# Patient Record
Sex: Male | Born: 1955 | Race: White | Hispanic: No | Marital: Married | Smoking: Never smoker
Health system: Southern US, Community
[De-identification: ages and names within clinical notes are randomized; demographics above are authoritative.]

## PROBLEM LIST (undated history)

## (undated) DIAGNOSIS — Z973 Presence of spectacles and contact lenses: Secondary | ICD-10-CM

## (undated) DIAGNOSIS — Z8719 Personal history of other diseases of the digestive system: Secondary | ICD-10-CM

## (undated) DIAGNOSIS — K219 Gastro-esophageal reflux disease without esophagitis: Secondary | ICD-10-CM

## (undated) DIAGNOSIS — M1990 Unspecified osteoarthritis, unspecified site: Secondary | ICD-10-CM

## (undated) DIAGNOSIS — I10 Essential (primary) hypertension: Secondary | ICD-10-CM

## (undated) HISTORY — DX: Essential (primary) hypertension: I10

## (undated) HISTORY — PX: KNEE SURGERY: SHX244

## (undated) HISTORY — PX: ANKLE FRACTURE SURGERY: SHX122

## (undated) HISTORY — PX: VASECTOMY: SHX75

---

## 2002-01-27 ENCOUNTER — Encounter: Admission: RE | Admit: 2002-01-27 | Discharge: 2002-02-20 | Payer: Self-pay | Admitting: Orthopaedic Surgery

## 2003-10-31 ENCOUNTER — Ambulatory Visit (HOSPITAL_COMMUNITY): Admission: EM | Admit: 2003-10-31 | Discharge: 2003-10-31 | Payer: Self-pay | Admitting: Emergency Medicine

## 2010-04-25 ENCOUNTER — Ambulatory Visit: Payer: Self-pay | Admitting: Orthopedic Surgery

## 2010-04-25 DIAGNOSIS — IMO0002 Reserved for concepts with insufficient information to code with codable children: Secondary | ICD-10-CM | POA: Insufficient documentation

## 2010-04-25 DIAGNOSIS — M5430 Sciatica, unspecified side: Secondary | ICD-10-CM | POA: Insufficient documentation

## 2010-04-25 DIAGNOSIS — M1710 Unilateral primary osteoarthritis, unspecified knee: Secondary | ICD-10-CM

## 2010-05-26 ENCOUNTER — Emergency Department (HOSPITAL_COMMUNITY)
Admission: EM | Admit: 2010-05-26 | Discharge: 2010-05-26 | Payer: Self-pay | Source: Home / Self Care | Admitting: Emergency Medicine

## 2010-07-11 ENCOUNTER — Ambulatory Visit (HOSPITAL_COMMUNITY)
Admission: RE | Admit: 2010-07-11 | Discharge: 2010-07-11 | Payer: Self-pay | Source: Home / Self Care | Attending: Family Medicine | Admitting: Family Medicine

## 2010-07-26 NOTE — Letter (Signed)
Summary: History Form  History Form   Imported By: Jacklynn Ganong 04/27/2010 08:56:07  _____________________________________________________________________  External Attachment:    Type:   Image     Comment:   External Document

## 2010-07-26 NOTE — Assessment & Plan Note (Signed)
Summary: left knee pain needs xr/umr/bsf   Vital Signs:  Patient profile:   55 year old male Height:      73 inches Weight:      307 pounds Pulse rate:   80 / minute Resp:     18 per minute  Vitals Entered By: Fuller Canada MD (April 25, 2010 4:18 PM)  Visit Type:  new patient Referring Provider:  self Primary Provider:  Dr. Lysbeth Galas  CC:  left knee pain.  History of Present Illness: this is a 55 year old male comes to [place] today complaining of severe history of LEFT knee pain status post one injection several years ago which seemed to help and now has swelling and catching in the LEFT knee.  There is also some lateral thigh pain which he associates with his intermittent sciatica with associated sharp burning sensation along the lateral thigh  Pain level is 5/10.  We'll take an x-ray today.  Meds: Exforge, Crestor, Ibuprofen, Omeprazole.    Allergies (verified): 1)  ! Voltaren  Past History:  Past Medical History: htn cholesterol  Past Surgical History: vasectomy right knee scope left ankle otif  Family History: Family History of Arthritis  Social History: Patient is married.  surgery work no smoking no alcohol coffee and sodas everyday AD Nursing  Review of Systems Constitutional:  Complains of fatigue; denies weight loss, weight gain, fever, and chills. Cardiovascular:  Denies chest pain, palpitations, fainting, and murmurs. Respiratory:  Denies short of breath, wheezing, couch, tightness, pain on inspiration, and snoring . Gastrointestinal:  Denies heartburn, nausea, vomiting, diarrhea, constipation, and blood in your stools. Genitourinary:  Denies frequency, urgency, difficulty urinating, painful urination, flank pain, and bleeding in urine. Neurologic:  Denies numbness, tingling, unsteady gait, dizziness, tremors, and seizure. Musculoskeletal:  Complains of joint pain, swelling, and instability; denies stiffness, redness, heat, and muscle  pain. Endocrine:  Denies excessive thirst, exessive urination, and heat or cold intolerance. Psychiatric:  Denies nervousness, depression, anxiety, and hallucinations. Skin:  Denies changes in the skin, poor healing, rash, itching, and redness. HEENT:  Denies blurred or double vision, eye pain, redness, and watering. Immunology:  Denies seasonal allergies, sinus problems, and allergic to bee stings. Hemoatologic:  Denies easy bleeding and brusing.  Physical Exam  Skin:  intact without lesions or rashes Inguinal Nodes:  no significant adenopathy Psych:  alert and cooperative; normal mood and affect; normal attention span and concentration   Knee Exam  General:    Well-developed, well-nourished, large body habitus; no deformities, normal grooming.  Gait:    Normal heel-toe gait pattern bilaterally.    Vascular:    There was no swelling or varicose veins. The pulses and temperature are normal. There was no edema or tenderness.  Sensory:    Gross coordination and sensation were normal.    Motor:    Motor strength 5/5 bilaterally for quadriceps, hamstrings, ankle dorsiflexion, and ankle plantar flexion.    Reflexes:    Normal and symmetric patellar and Achilles reflexes bilaterally.    Knee Exam:    Right:    Inspection:  Normal    Palpation:  Normal    Stability:  stable    Tenderness:  no    Swelling:  no    Erythema:  no    Range of Motion:       Flexion-Active: full       Extension-Active: full       Flexion-Passive: full       Extension-Passive: full  Left:    Inspection:  Normal    Palpation:  Normal    Stability:  stable    Tenderness:  no    Swelling:  no    Erythema:  no    although he had some mild lateral joint line tenderness this was not in any way very significant or severe and there is no joint effusion.    Range of Motion:       Flexion-Active: full       Extension-Active: full       Flexion-Passive: full       Extension-Passive: full     lumbar spine exam showed lumbar tenderness in the central and LEFT lateral areas including the gluteal region with tenderness and palpable tenderness there.  Straight leg raise was negative today.   Impression & Recommendations:  Problem # 1:  KNEE, ARTHRITIS, DEGEN./OSTEO (ICD-715.96)  knee films AP lateral and patellofemoral x-rays show that there is perhaps some mild degenerative changes but overall his knee looks very good with reasonably normal alignment  Impression normal knee film  Orders: New Patient Level III (04540) Knee x-ray,  3 views (98119)  Problem # 2:  SCIATICA (ICD-724.3)  Orders: New Patient Level III (14782) Knee x-ray,  3 views (95621)  Medications Added to Medication List This Visit: 1)  Prednisone (pak) 10 Mg Tabs (Prednisone) .... As directed 2)  Neurontin 100 Mg Caps (Gabapentin) .Marland Kitchen.. 1 by mouth hs increase up to 3 as needed  Patient Instructions: 1)  Start new meds 2)  come back in 3 weeks Prescriptions: NEURONTIN 100 MG CAPS (GABAPENTIN) 1 by mouth hs increase up to 3 as needed  #60 x 1   Entered and Authorized by:   Fuller Canada MD   Signed by:   Fuller Canada MD on 04/25/2010   Method used:   Print then Give to Patient   RxID:   [id number] PREDNISONE (PAK) 10 MG TABS (PREDNISONE) as directed  #1 x 1   Entered and Authorized by:   Fuller Canada MD   Signed by:   Fuller Canada MD on 04/25/2010   Method used:   Print then Give to Patient   RxID:   [id number]    Orders Added: 1)  New Patient Level III [44034] 2)  Knee x-ray,  3 views [74259]

## 2010-11-11 NOTE — Op Note (Signed)
Jake Clark, Jake Clark                           ACCOUNT NO.:  [id number]   MEDICAL RECORD NO.:  [id number]                   PATIENT TYPE:  EMS   LOCATION:  ED                                   FACILITY[facility]  [facility]   PHYSICIAN:  Lubertha Basque. Jerl Santos, M.D.             DATE OF BIRTH:  [birth year]   DATE OF PROCEDURE:  10/31/2003  DATE OF DISCHARGE:                                 OPERATIVE REPORT   PREOPERATIVE DIAGNOSIS:  Left ankle bimalleolar fracture.   POSTOPERATIVE DIAGNOSIS:  Left ankle bimalleolar fracture.   OPERATION/PROCEDURE:  Left ankle open reduction and internal fixation.   ANESTHESIA:  General.   SURGEON:  Lubertha Basque. Jerl Santos, M.D.   INDICATIONS:  The patient is a 55 year old man who fractured his left ankle  today while cleaning the pool at home.  He sustained a displaced bimalleolar  fracture.  He was seen in the [facility] emergency room and orthopedics  were consulted for evaluation and management.  He opted for ORIF and hope  for better alignment of joint and minimize the chance of degenerative  changes in the future.  Informed operative consent was obtained, after  discussion of possible complications, reaction to anesthesia, infection,  ankle stiffness, and DVT.   DESCRIPTION OF PROCEDURE:  The patient was placed on the operating table  where general anesthesia was applied without difficulty.  He was positioned  supine with a bump under the left hip.  He was prepped and draped in the  normal sterile fashion.  After administration of IV antibiotics, the left  leg was elevated, exsanguinated, and tourniquet inflated about the calf.  A  lateral incision was made with dissection down to the fibula and the  fracture site. This was reduced anatomically with a reduction clamp and  stabilizer for a single interfragmentary screw which was a fully threaded  3.5 mm small fragment screw.  We then added extra stabilization for  __________  one-third tubular side plate which was  appropriately contoured  and then secured to the fibula with six screws.  Five of these were fully  threaded cortical screws and bicortical __________  on each.  Then the sixth  screw was a partially threaded cancellous screw except for the fibula.  Attention was then turned toward the medial aspect.  Incision was made  across the fracture site.  Dissection was carried down to the fracture site.  Ligamentous tissues were used for removing the fracture site and was reduced  anatomically and stabilized the two malleolar screws which were partially  threaded cancellous screws.  I used fluoroscopy throughout the case to make  appropriate intraoperative vision readily to use myself.  The wounds were  irrigated.  Proper reapproximation of the subcutaneous tissue with 0 and 2-0  Vicryl and skin with staples.  Tourniquet was deflated __________  more  immediately.  Marcaine was injected about the incision site followed by  Adaptic  and a dry gauze dressing with the posterior splint of Plaster with  the ankle in neutral position.  Estimated blood loss was __________  .  __________  tourniquet time.   DISPOSITION:  The patient was extubated in the operating room and taken to  the recovery room stable.  Plans for him to go home the same day and follow  up in the office in one week.  I will contact him by phone tonight.                                              Lubertha Basque Jerl Santos, M.D.   PGD/MEDQ  D:  10/31/2003  T:  10/31/2003  Job:  [id number]

## 2011-05-23 ENCOUNTER — Ambulatory Visit (INDEPENDENT_AMBULATORY_CARE_PROVIDER_SITE_OTHER): Payer: 59 | Admitting: Orthopedic Surgery

## 2011-05-23 ENCOUNTER — Encounter: Payer: Self-pay | Admitting: Orthopedic Surgery

## 2011-05-23 VITALS — BP 140/80 | Ht 73.0 in | Wt 314.0 lb

## 2011-05-23 DIAGNOSIS — IMO0002 Reserved for concepts with insufficient information to code with codable children: Secondary | ICD-10-CM

## 2011-05-23 DIAGNOSIS — S83209A Unspecified tear of unspecified meniscus, current injury, unspecified knee, initial encounter: Secondary | ICD-10-CM

## 2011-05-23 NOTE — Progress Notes (Signed)
LEFT knee pain with some LEFT hip pain.  Follow up visit.  Seen last year had an x-ray, which really did not show any significant knee pathology. Presents back same. It is hard for him to straighten his leg. He has anterior knee pain. He will have some lumbar and lateral hip pain at times.  He seems to get relief if he can get the knee to pop.  Review of systems catching, locking, giving way:   His previous x-ray shows that he has some L5-S1 facet arthritis.  Physical examination: RIGHT knee full range of motion no swelling. No tenderness, stability, normal strength, normal skin normal pulses, and temperature, normal.  LEFT knee medial joint line tenderness normal range of motion. Small effusion. Strength normal skin normal stability normal positive McMurray sign. Severe pain with rotation of the leg. Distal neurovascular exam normal.  Recommend Ultracet for pain one q. 4 p.r.n. Pain #60 no refills.  Recommend MRI and a 2 week followup

## 2011-05-23 NOTE — Patient Instructions (Signed)
ultracet for pain 1 q 4 prn

## 2011-05-25 ENCOUNTER — Ambulatory Visit (HOSPITAL_COMMUNITY)
Admission: RE | Admit: 2011-05-25 | Discharge: 2011-05-25 | Disposition: A | Discharge status: Home / Self Care | Payer: 59 | Source: Ambulatory Visit | Attending: Orthopedic Surgery | Admitting: Orthopedic Surgery

## 2011-05-25 DIAGNOSIS — S83209A Unspecified tear of unspecified meniscus, current injury, unspecified knee, initial encounter: Secondary | ICD-10-CM

## 2011-05-25 DIAGNOSIS — M25569 Pain in unspecified knee: Secondary | ICD-10-CM | POA: Insufficient documentation

## 2011-05-25 DIAGNOSIS — R937 Abnormal findings on diagnostic imaging of other parts of musculoskeletal system: Secondary | ICD-10-CM | POA: Insufficient documentation

## 2011-05-26 NOTE — Progress Notes (Signed)
Contacted patient, scheduled appointment as noted.

## 2011-05-29 ENCOUNTER — Encounter: Payer: Self-pay | Admitting: Orthopedic Surgery

## 2011-05-29 ENCOUNTER — Ambulatory Visit (INDEPENDENT_AMBULATORY_CARE_PROVIDER_SITE_OTHER): Payer: 59 | Admitting: Orthopedic Surgery

## 2011-05-29 VITALS — Ht 73.0 in | Wt 314.0 lb

## 2011-05-29 DIAGNOSIS — M6750 Plica syndrome, unspecified knee: Secondary | ICD-10-CM

## 2011-05-29 DIAGNOSIS — M79659 Pain in unspecified thigh: Secondary | ICD-10-CM

## 2011-05-29 DIAGNOSIS — M79609 Pain in unspecified limb: Secondary | ICD-10-CM

## 2011-05-29 NOTE — Progress Notes (Signed)
The patient complained of click in his LEFT leg and knee area complains of thigh pain and pain when he lifts his leg or flex his hip and also when he extends his knee. MRI shows medial plica otherwise, fairly normal. Hip, wrist. 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 Also, having tenderness in the LEFT thigh.  I do not palpate a mass.  RIGHT thigh normal to palpation.  The tenderness in the LEFT eye is approximately mid thigh, lateral, and portion of the thigh muscle.  Recommend x-ray RIGHT thigh, and if negative. Physical therapy with ultrasound treatment, LEFT thigh.  Inject medial plica Knee  Injection Procedure Note  Pre-operative Diagnosis: left knee plica Post-operative Diagnosis: same  Indications: pain  Anesthesia: ethyl chloride   Procedure Details   Verbal consent was obtained for the procedure. Time out was completed.The joint was prepped with alcohol, followed by  Ethyl chloride spray and A 20 gauge needle was inserted into the knee via medial  approach; 4ml 1% lidocaine and 1 ml of depomedrol  was then injected into the joint . The needle was removed and the area cleansed and dressed.  Complications:  None; patient tolerated the procedure well.  Secondary to thigh pain patient was advised to have x-ray of his LEFT femur  AP lateral LEFT femur including hip  Findings the bone looks normal.  There is some loss of femoral head offset but the joint space is normal.  Impression normal femur  Not sure what is causing the thigh pain.  Recommend ultrasound treatments x6 with iontophoresis.  If no improvement MRI left eye  If no improvement from injection related to the plica recommend plica excision.

## 2011-05-29 NOTE — Patient Instructions (Signed)
You have received a steroid shot. 15% of patients experience increased pain at the injection site with in the next 24 hours. This is best treated with ice and tylenol extra strength 2 tabs every 8 hours. If you are still having pain please call the office.    

## 2011-06-08 ENCOUNTER — Ambulatory Visit: Payer: 59 | Admitting: Orthopedic Surgery

## 2011-06-12 ENCOUNTER — Telehealth: Payer: Self-pay | Admitting: Orthopedic Surgery

## 2011-06-12 NOTE — Telephone Encounter (Signed)
Patient called to cancel his follow up appointment for tomorrow,06/13/11, as his therapy will just be starting Wed, 06/14/11.  He elects to call back to re-schedule.

## 2011-06-13 ENCOUNTER — Ambulatory Visit: Payer: 59 | Admitting: Orthopedic Surgery

## 2011-06-14 ENCOUNTER — Ambulatory Visit (HOSPITAL_COMMUNITY)
Admission: RE | Admit: 2011-06-14 | Discharge: 2011-06-14 | Disposition: A | Discharge status: Home / Self Care | Payer: 59 | Source: Ambulatory Visit | Attending: Orthopedic Surgery | Admitting: Orthopedic Surgery

## 2011-06-14 DIAGNOSIS — M25559 Pain in unspecified hip: Secondary | ICD-10-CM | POA: Insufficient documentation

## 2011-06-14 DIAGNOSIS — IMO0001 Reserved for inherently not codable concepts without codable children: Secondary | ICD-10-CM | POA: Insufficient documentation

## 2011-06-14 DIAGNOSIS — M25569 Pain in unspecified knee: Secondary | ICD-10-CM | POA: Insufficient documentation

## 2011-06-14 NOTE — Patient Instructions (Addendum)
HEP stretches

## 2011-06-14 NOTE — Progress Notes (Signed)
Physical Therapy Evaluation  Patient Details  Name: Jake Clark MRN: [id number] Date of Birth: 08/19/1955  Today's Date: 06/14/2011 Time: 2130-8657 Time Calculation (min): 45 min Visit#: 1  of 6   Re-eval: 06/28/11 Assessment Diagnosis: thigh pain Next MD Visit: 06/2011 Prior Therapy: none  Past Medical History:  Past Medical History  Diagnosis Date  . HTN (hypertension)    Past Surgical History:  Past Surgical History  Procedure Date  . Knee surgery   . Ankle fracture surgery   . Vasectomy     Subjective Symptoms/Limitations Symptoms: Mr. Loll states that he began having pain in his left thigh several months ago.  The patient states that he has had back pain for two years which occasionally goes into his thigh and knee but his MD does not think that this is what is causing his pain.   The patient states that the pain is mainly in the side of this hip and runs down into his knee but will occasionally be only in the knee area.   He states tht the pain occurs at least once a week but last week it hurt every day.  So far this week he has not hurt at all.  The patient states he can not find any activity that proceeds his pain.  He has been referred to physical therapy for ultrasound and iontophoresis to try and decrease his symptoms of pain. How long can you sit comfortably?: When he is experiencing the pain sitting  will increase the pain after about 15 minutes. How long can you walk comfortably?: When it is hurting the pain will increase after about 15 minutes. Pain Assessment Currently in Pain?:  The patient denies pain today but states when he does hurt the pain will be at a 9 on a 0-10 scale. 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   Assessment LLE Strength Left Hip Flexion: 4/5 Left Hip Extension: 5/5 Left Hip ABduction: 4/5 Left Hip ADduction: 5/5 Left Knee Flexion: 5/5 Left Knee  Extension: 5/5 Left Ankle Dorsiflexion: 5/5  Exercise/Treatments Stretches Active Hamstring Stretch: 2 reps;60 seconds Quad Stretch: 2 reps;60 seconds ITB Stretch: 2 reps;30 seconds     Modalities Modalities: Ultrasound;Iontophoresis Ultrasound Ultrasound Location: L greater Trochanter Ultrasound Parameters: 1.2 w/cm 2@  1 MgHzx 8:00' Ultrasound Goals: Pain Iontophoresis Type of Iontophoresis: Dexamethasone Location: L hip  Dose: using the original fast pads 1.3 ml Dex Time: 14 hours  Physical Therapy Assessment and Plan PT Assessment and Plan Clinical Impression Statement: Pt with vague subjective pain who may benefit from skilled PT for modalities to decrease pain. Rehab Potential: Good Clinical Impairments Affecting Rehab Potential: Pain, stiffness PT Frequency: Min 3X/week PT Duration:  (2 weeks) PT Plan: see for ultrasound and ionto    Goals Home Exercise Program Pt will Perform Home Exercise Program: Independently PT Short Term Goals Time to Complete Short Term Goals: 2 weeks PT Short Term Goal 1: no pain x 2 weeks  Problem List Patient Active Problem List  Diagnoses  . KNEE, ARTHRITIS, DEGEN./OSTEO  . SCIATICA  . Torn meniscus  . Plica of knee    PT - End of Session Activity Tolerance: Patient tolerated treatment well General Behavior During Session: [facility] for tasks performed Cognition: [facility] for tasks performed   RUSSELL,CINDY 06/14/2011, 4:32 PM  Physician Documentation Your signature is required to indicate approval of the treatment plan as stated above.  Please sign and either  send electronically or make a copy of this report for your files and return this physician signed original.   Please mark one 1.__approve of plan  2. ___approve of plan with the following conditions.   ______________________________                                                          _____________________ Physician Signature                                                                                                              Date

## 2011-06-15 ENCOUNTER — Ambulatory Visit (HOSPITAL_COMMUNITY): Payer: 59

## 2011-06-16 ENCOUNTER — Ambulatory Visit (HOSPITAL_COMMUNITY)
Admission: RE | Admit: 2011-06-16 | Discharge: 2011-06-16 | Disposition: A | Discharge status: Home / Self Care | Payer: 59 | Source: Ambulatory Visit | Attending: Family Medicine | Admitting: Family Medicine

## 2011-06-16 NOTE — Progress Notes (Signed)
Physical Therapy Treatment Patient Details  Name: Jake Clark MRN: [id number] Date of Birth: Jun 03, 1956  Today's Date: 06/16/2011 Time: 1153-1220 Time Calculation (min): 27 min Visit#: 2  of 6   Re-eval: 06/28/11  Charge: [place] 8 min ionto 8 min  Subjective: Symptoms/Limitations Symptoms: Pt reported has had min pain L thigh, hip and lower back.  The pain comes and goes intermittently.  Pain scale today 2-3/10. Pain Assessment Currently in Pain?: Yes Pain Score:   2 Pain Location: Hip Pain Orientation: Left  Objective:     Exercise/Treatments  Modalities Modalities: Ultrasound Ultrasound Ultrasound Location: L greater Trochanter Ultrasound Parameters: 1.2 w/cm2 @ 1 MgHzx 8:00' continuous Ultrasound Goals: Pain Iontophoresis Type of Iontophoresis: Dexamethasone Location: L hip Dose: using the original fast pads 1.3 ml Dex  Time: 14 hours  Physical Therapy Assessment and Plan PT Assessment and Plan Clinical Impression Statement: [place] and ionto complete for today's session.  Pt did report compliance with stretches at home.   PT Plan: Assess pain relief, continue with [place] and ionto.    Goals    Problem List Patient Active Problem List  Diagnoses  . KNEE, ARTHRITIS, DEGEN./OSTEO  . SCIATICA  . Torn meniscus  . Plica of knee    PT - End of Session Activity Tolerance: Patient tolerated treatment well General Behavior During Session: [facility] for tasks performed Cognition: [facility] for tasks performed  Juel Burrow 06/16/2011, 12:26 PM

## 2011-06-22 ENCOUNTER — Ambulatory Visit (HOSPITAL_COMMUNITY): Payer: 59

## 2011-06-22 ENCOUNTER — Telehealth (HOSPITAL_COMMUNITY): Payer: Self-pay

## 2012-03-21 ENCOUNTER — Ambulatory Visit (INDEPENDENT_AMBULATORY_CARE_PROVIDER_SITE_OTHER): Payer: 59 | Admitting: Otolaryngology

## 2015-06-10 ENCOUNTER — Ambulatory Visit: Payer: Self-pay | Admitting: Orthopedic Surgery

## 2015-06-10 NOTE — Progress Notes (Signed)
Preoperative surgical orders have been place into the Epic hospital system for Jake Clark on 06/10/2015, 10:23 AM  by Patrica DuelPERKINS, Sherwood Castilla for surgery on 07-07-2015.  Preop Total Hip orders including Experel Injecion, IV Tylenol, and IV Decadron as long as there are no contraindications to the above medications. Jake Peacerew Jayshawn Colston, PA-C

## 2015-06-29 ENCOUNTER — Ambulatory Visit: Payer: Self-pay | Admitting: Orthopedic Surgery

## 2015-06-29 NOTE — H&P (Signed)
Jake Clark DOB: [birth year] Married / Language: English / Race: White Male Date of Admission:  07/07/2015 CC:  Left hip pain History of Present Illness The patient is a 60 year old male who comes in for a preoperative History and Physical. The patient is scheduled for a left total hip arthroplasty (anterior) to be performed by Dr. Gus RankinFrank V. Aluisio, MD at [facility] on 07-07-2015. The patient is a 60 year old male who presented for follow up of their hip. The patient is being followed for their left hip pain and osteoarthritis. They are now months out from intra-articular injection. Symptoms reported include: pain and aching. The patient feels that they are doing poorly and report their pain level to be moderate to severe. The following medication has been used for pain control: antiinflammatory medication (meloxicam). The patient has not gotten any relief of their symptoms with Cortisone injections. Jake Clark said that the hip injection provided very short term benefit. His hip is getting progressively worse over time. Pain is in his groin radiating to his knee. It is limiting what he can and cannot do. He is occasionally getting pain at night. His right hip does not bother him. He is ready to get the left hip fixed. They have been treated conservatively in the past for the above stated problem and despite conservative measures, they continue to have progressive pain and severe functional limitations and dysfunction. They have failed non-operative management including home exercise, medications, and injections. It is felt that they would benefit from undergoing total joint replacement. Risks and benefits of the procedure have been discussed with the patient and they elect to proceed with surgery. There are no active contraindications to surgery such as ongoing infection or rapidly progressive neurological disease.  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 Latex  skin sensitivity  Family History Rheumatoid Arthritis  mother and father  Social History Living situation  live with spouse Marital status  married Illicit drug use  no Drug/Alcohol Rehab (Previously)  no Exercise  Exercises weekly; does running / walking Tobacco use  never smoker Number of flights of stairs before winded  4-5 Pain Contract  no Drug/Alcohol Rehab (Currently)  no Children  1 Current work status  working full time Alcohol use  never consumed alcohol  Medication History AmLODIPine Besylate (5MG  Tablet, Oral) Active. Valsartan (160MG  Tablet, Oral) Active. Lipitor (10MG  Tablet, Oral) Active. Mobic (7.5MG  Tablet, Oral two times daily) Active. HydroCHLOROthiazide (12.5MG  Tablet, Oral) Active. Aspirin (81MG  Tablet Chewable, Oral) Active.  Past Surgical History Vasectomy  Ankle Surgery  left Arthroscopy of Knee  left  Review of Systems General Not Present- Chills, Fatigue, Fever, Memory Loss, Night Sweats, Weight Gain and Weight Loss. Skin Not Present- Eczema, Hives, Itching, Lesions and Rash. HEENT Not Present- Dentures, Double Vision, Headache, Hearing Loss, Tinnitus and Visual Loss. Respiratory Not Present- Allergies, Chronic Cough, Coughing up blood, Shortness of breath at rest and Shortness of breath with exertion. Cardiovascular Not Present- Chest Pain, Difficulty Breathing Lying Down, Murmur, Palpitations, Racing/skipping heartbeats and Swelling. Gastrointestinal Not Present- Abdominal Pain, Bloody Stool, Constipation, Diarrhea, Difficulty Swallowing, Heartburn, Jaundice, Loss of appetitie, Nausea and Vomiting. Male Genitourinary Not Present- Blood in Urine, Discharge, Flank Pain, Incontinence, Painful  Urination, Urgency, Urinary frequency, Urinary Retention, Urinating at Night and Weak  urinary stream. Musculoskeletal Present- Joint Pain. Not Present- Back Pain, Joint Swelling, Morning Stiffness, Muscle Pain, Muscle Weakness and Spasms. Neurological Not Present- Blackout spells, Difficulty with balance, Dizziness, Paralysis, Tremor and Weakness. Psychiatric Not Present- Insomnia.  Vitals Weight: 269 lb Height: 72in Body Surface Area: 2.42 m Body Mass Index: 36.48 kg/m  BP: 142/76 (Sitting, Right Arm, Standard)   Physical Exam  General Mental Status -Alert, cooperative and good historian. General Appearance-pleasant, Not in acute distress. Orientation-Oriented X3. Build & Nutrition-Well nourished and Well developed.  Head and Neck Head-normocephalic, atraumatic . Neck Global Assessment - supple, no bruit auscultated on the right, no bruit auscultated on the left.  Eye Vision-Wears contact lenses. Pupil - Bilateral-Regular and Round. Motion - Bilateral-EOMI.  Chest and Lung Exam Auscultation Breath sounds - clear at anterior chest wall and clear at posterior chest wall. Adventitious sounds - No Adventitious sounds.  Cardiovascular Auscultation Rhythm - Regular rate and rhythm. Heart Sounds - S1 WNL and S2 WNL. Murmurs & Other Heart Sounds - Auscultation of the heart reveals - No Murmurs.  Abdomen Palpation/Percussion Tenderness - Abdomen is non-tender to palpation. Rigidity (guarding) - Abdomen is soft. Auscultation Auscultation of the abdomen reveals - Bowel sounds normal.  Male Genitourinary Note: Not done, not pertinent to present illness   Musculoskeletal Note: On exam, he is alert and oriented, in no apparent distress. His right hip has normal range of motion without discomfort. His left hip flexion is to about 100, no internal rotation, about 20 external rotation, 20 abduction.  RADIOGRAPHS AP pelvis and lateral of the hip and he has  got bone on bone arthritic change with osteophyte formation.  Assessment & Plan Primary osteoarthritis of left hip (Principal Diagnosis) (M16.12)  Note:Surgical Plans: Left Total Hip Replacement - Anterior Approach  Disposition: Home with wife  PCP: Dr. Lysbeth GalasNyland  IV TXA  Anesthesia Issues: None  Signed electronically by Lauraine RinneAlexzandrew L Perkins, III PA-C

## 2015-06-30 ENCOUNTER — Encounter (HOSPITAL_COMMUNITY): Admission: RE | Admit: 2015-06-30 | Payer: 59 | Source: Ambulatory Visit

## 2015-07-01 ENCOUNTER — Encounter (HOSPITAL_COMMUNITY): Payer: Self-pay

## 2015-07-01 ENCOUNTER — Encounter (HOSPITAL_COMMUNITY)
Admission: RE | Admit: 2015-07-01 | Discharge: 2015-07-01 | Disposition: A | Discharge status: Home / Self Care | Payer: 59 | Source: Ambulatory Visit | Attending: Orthopedic Surgery | Admitting: Orthopedic Surgery

## 2015-07-01 DIAGNOSIS — Z01818 Encounter for other preprocedural examination: Secondary | ICD-10-CM | POA: Insufficient documentation

## 2015-07-01 DIAGNOSIS — M1612 Unilateral primary osteoarthritis, left hip: Secondary | ICD-10-CM | POA: Diagnosis not present

## 2015-07-01 HISTORY — DX: Presence of spectacles and contact lenses: Z97.3

## 2015-07-01 HISTORY — DX: Personal history of other diseases of the digestive system: Z87.19

## 2015-07-01 HISTORY — DX: Unspecified osteoarthritis, unspecified site: M19.90

## 2015-07-01 HISTORY — DX: Gastro-esophageal reflux disease without esophagitis: K21.9

## 2015-07-01 LAB — URINALYSIS, ROUTINE W REFLEX MICROSCOPIC
Bilirubin Urine: NEGATIVE
GLUCOSE, UA: NEGATIVE mg/dL
HGB URINE DIPSTICK: NEGATIVE
KETONES UR: NEGATIVE mg/dL
Nitrite: NEGATIVE
PH: 6 (ref 5.0–8.0)
PROTEIN: NEGATIVE mg/dL
Specific Gravity, Urine: 1.017 (ref 1.005–1.030)

## 2015-07-01 LAB — URINE MICROSCOPIC-ADD ON

## 2015-07-01 LAB — SURGICAL PCR SCREEN
MRSA, PCR: NEGATIVE
Staphylococcus aureus: NEGATIVE

## 2015-07-01 LAB — COMPREHENSIVE METABOLIC PANEL
ALT: 21 U/L (ref 17–63)
ANION GAP: 8 (ref 5–15)
AST: 19 U/L (ref 15–41)
Albumin: 4.2 g/dL (ref 3.5–5.0)
Alkaline Phosphatase: 116 U/L (ref 38–126)
BILIRUBIN TOTAL: 0.9 mg/dL (ref 0.3–1.2)
BUN: 11 mg/dL (ref 6–20)
CHLORIDE: 105 mmol/L (ref 101–111)
CO2: 28 mmol/L (ref 22–32)
Calcium: 9.5 mg/dL (ref 8.9–10.3)
Creatinine, Ser: 0.64 mg/dL (ref 0.61–1.24)
Glucose, Bld: 113 mg/dL — ABNORMAL HIGH (ref 65–99)
POTASSIUM: 3.8 mmol/L (ref 3.5–5.1)
Sodium: 141 mmol/L (ref 135–145)
TOTAL PROTEIN: 7.2 g/dL (ref 6.5–8.1)

## 2015-07-01 LAB — PROTIME-INR
INR: 0.98 (ref 0.00–1.49)
PROTHROMBIN TIME: 13.2 s (ref 11.6–15.2)

## 2015-07-01 LAB — CBC
HEMATOCRIT: 44.5 % (ref 39.0–52.0)
Hemoglobin: 14.7 g/dL (ref 13.0–17.0)
MCH: 29.1 pg (ref 26.0–34.0)
MCHC: 33 g/dL (ref 30.0–36.0)
MCV: 87.9 fL (ref 78.0–100.0)
PLATELETS: 273 10*3/uL (ref 150–400)
RBC: 5.06 MIL/uL (ref 4.22–5.81)
RDW: 12.5 % (ref 11.5–15.5)
WBC: 7.1 10*3/uL (ref 4.0–10.5)

## 2015-07-01 LAB — ABO/RH: ABO/RH(D): B POS

## 2015-07-01 LAB — APTT: aPTT: 28 seconds (ref 24–37)

## 2015-07-01 NOTE — Progress Notes (Signed)
Your patient has screened at an elevated risk for Obstructive Sleep Apnea using the Stop-Bang Tool during a pre-surgical visit. Patient scored at high risk.  

## 2015-07-01 NOTE — Progress Notes (Signed)
EKG / chart 02/25/2015

## 2015-07-01 NOTE — Patient Instructions (Signed)
Jake Clark  07/01/2015   Your procedure is scheduled on: Wednesday July 07, 2015   [facility]  Entrance take [place]  elevators to 3rd floor to  Short Stay Center at 6:30 AM.  Call this number if you have problems the morning of surgery [phone]   Remember: ONLY 1 PERSON MAY GO WITH YOU TO SHORT STAY TO GET  READY MORNING OF YOUR SURGERY.  Do not eat food or drink liquids :After Midnight.     Take these medicines the morning of surgery with A SIP OF WATER: Amlodipine (Norvasc)                               You may not have any metal on your body including hair pins and              piercings  Do not wear jewelry,otions, powders or colognes, deodorant                           Men may shave face and neck.   Do not bring valuables to the hospital. [facility] IS NOT             RESPONSIBLE   FOR VALUABLES.  Contacts, dentures or bridgework may not be worn into surgery.  Leave suitcase in the car. After surgery it may be brought to your room.                Please read over the following fact sheets you were given:INCENTIVE SPIROMETER; MRSA INFORMATION SHEET; BLOOD TRANSFUSION INFORMATION SHEET  _____________________________________________________________________             [facility] - Preparing for Surgery Before surgery, you can play an important role.  Because skin is not sterile, your skin needs to be as free of germs as possible.  You can reduce the number of germs on your skin by washing with CHG (chlorahexidine gluconate) soap before surgery.  CHG is an antiseptic cleaner which kills germs and bonds with the skin to continue killing germs even after washing. Please DO NOT use if you have an allergy to CHG or antibacterial soaps.  If your skin becomes reddened/irritated stop using the CHG and inform your nurse when you arrive at Short Stay. Do not shave (including legs and underarms) for at least 48 hours prior to the first CHG  shower.  You may shave your face/neck. Please follow these instructions carefully:  1.  Shower with CHG Soap the night before surgery and the  morning of Surgery.  2.  If you choose to wash your hair, wash your hair first as usual with your  normal  shampoo.  3.  After you shampoo, rinse your hair and body thoroughly to remove the  shampoo.                           4.  Use CHG as you would any other liquid soap.  You can apply chg directly  to the skin and wash                       Gently with a scrungie or clean washcloth.  5.  Apply the CHG Soap to your body ONLY FROM  THE NECK DOWN.   Do not use on face/ open                           Wound or open sores. Avoid contact with eyes, ears mouth and genitals (private parts).                       Wash face,  Genitals (private parts) with your normal soap.             6.  Wash thoroughly, paying special attention to the area where your surgery  will be performed.  7.  Thoroughly rinse your body with warm water from the neck down.  8.  DO NOT shower/wash with your normal soap after using and rinsing off  the CHG Soap.                9.  Pat yourself dry with a clean towel.            10.  Wear clean pajamas.            11.  Place clean sheets on your bed the night of your first shower and do not  sleep with pets. Day of Surgery : Do not apply any lotions/deodorants the morning of surgery.  Please wear clean clothes to the hospital/surgery center.  FAILURE TO FOLLOW THESE INSTRUCTIONS MAY RESULT IN THE CANCELLATION OF YOUR SURGERY PATIENT SIGNATURE_________________________________  NURSE SIGNATURE__________________________________  ________________________________________________________________________   Jake Clark  An incentive spirometer is a tool that can help keep your lungs clear and active. This tool measures how well you are filling your lungs with each breath. Taking long deep breaths may help reverse or decrease the chance  of developing breathing (pulmonary) problems (especially infection) following:  A long period of time when you are unable to move or be active. BEFORE THE PROCEDURE   If the spirometer includes an indicator to show your best effort, your nurse or respiratory therapist will set it to a desired goal.  If possible, sit up straight or lean slightly forward. Try not to slouch.  Hold the incentive spirometer in an upright position. INSTRUCTIONS FOR USE   Sit on the edge of your bed if possible, or sit up as far as you can in bed or on a chair.  Hold the incentive spirometer in an upright position.  Breathe out normally.  Place the mouthpiece in your mouth and seal your lips tightly around it.  Breathe in slowly and as deeply as possible, raising the piston or the ball toward the top of the column.  Hold your breath for 3-5 seconds or for as long as possible. Allow the piston or ball to fall to the bottom of the column.  Remove the mouthpiece from your mouth and breathe out normally.  Rest for a few seconds and repeat Steps 1 through 7 at least 10 times every 1-2 hours when you are awake. Take your time and take a few normal breaths between deep breaths.  The spirometer may include an indicator to show your best effort. Use the indicator as a goal to work toward during each repetition.  After each set of 10 deep breaths, practice coughing to be sure your lungs are clear. If you have an incision (the cut made at the time of surgery), support your incision when coughing by placing a pillow or rolled up towels firmly against it. Once you are  able to get out of bed, walk around indoors and cough well. You may stop using the incentive spirometer when instructed by your caregiver.  RISKS AND COMPLICATIONS  Take your time so you do not get dizzy or light-headed.  If you are in pain, you may need to take or ask for pain medication before doing incentive spirometry. It is harder to take a deep  breath if you are having pain. AFTER USE  Rest and breathe slowly and easily.  It can be helpful to keep track of a log of your progress. Your caregiver can provide you with a simple table to help with this. If you are using the spirometer at home, follow these instructions: [place] IF:   You are having difficultly using the spirometer.  You have trouble using the spirometer as often as instructed.  Your pain medication is not giving enough relief while using the spirometer.  You develop fever of 100.5 F (38.1 C) or higher. SEEK IMMEDIATE MEDICAL CARE IF:   You cough up bloody sputum that had not been present before.  You develop fever of 102 F (38.9 C) or greater.  You develop worsening pain at or near the incision site. MAKE SURE YOU:   Understand these instructions.  Will watch your condition.  Will get help right away if you are not doing well or get worse. Document Released: 10/23/2006 Document Revised: 09/04/2011 Document Reviewed: 12/24/2006 ExitCare Patient Information 2014 Exit[employer], [place].   ________________________________________________________________________  WHAT IS A BLOOD TRANSFUSION? Blood Transfusion Information  A transfusion is the replacement of blood or some of its parts. Blood is made up of multiple cells which provide different functions.  Red blood cells carry oxygen and are used for blood loss replacement.  White blood cells fight against infection.  Platelets control bleeding.  Plasma helps clot blood.  Other blood products are available for specialized needs, such as hemophilia or other clotting disorders. BEFORE THE TRANSFUSION  Who gives blood for transfusions?   Healthy volunteers who are fully evaluated to make sure their blood is safe. This is blood bank blood. Transfusion therapy is the safest it has ever been in the practice of medicine. Before blood is taken from a donor, a complete history is taken to make sure  that person has no history of diseases nor engages in risky social behavior (examples are intravenous drug use or sexual activity with multiple partners). The donor's travel history is screened to minimize risk of transmitting infections, such as malaria. The donated blood is tested for signs of infectious diseases, such as HIV and hepatitis. The blood is then tested to be sure it is compatible with you in order to minimize the chance of a transfusion reaction. If you or a relative donates blood, this is often done in anticipation of surgery and is not appropriate for emergency situations. It takes many days to process the donated blood. RISKS AND COMPLICATIONS Although transfusion therapy is very safe and saves many lives, the main dangers of transfusion include:   Getting an infectious disease.  Developing a transfusion reaction. This is an allergic reaction to something in the blood you were given. Every precaution is taken to prevent this. The decision to have a blood transfusion has been considered carefully by your caregiver before blood is given. Blood is not given unless the benefits outweigh the risks. AFTER THE TRANSFUSION  Right after receiving a blood transfusion, you will usually feel much better and more energetic. This is especially  true if your red blood cells have gotten low (anemic). The transfusion raises the level of the red blood cells which carry oxygen, and this usually causes an energy increase.  The nurse administering the transfusion will monitor you carefully for complications. HOME CARE INSTRUCTIONS  No special instructions are needed after a transfusion. You may find your energy is better. Speak with your caregiver about any limitations on activity for underlying diseases you may have. SEEK MEDICAL CARE IF:   Your condition is not improving after your transfusion.  You develop redness or irritation at the intravenous (IV) site. SEEK IMMEDIATE MEDICAL CARE IF:  Any of  the following symptoms occur over the next 12 hours:  Shaking chills.  You have a temperature by mouth above 102 F (38.9 C), not controlled by medicine.  Chest, back, or muscle pain.  People around you feel you are not acting correctly or are confused.  Shortness of breath or difficulty breathing.  Dizziness and fainting.  You get a rash or develop hives.  You have a decrease in urine output.  Your urine turns a dark color or changes to pink, red, or brown. Any of the following symptoms occur over the next 10 days:  You have a temperature by mouth above 102 F (38.9 C), not controlled by medicine.  Shortness of breath.  Weakness after normal activity.  The white part of the eye turns yellow (jaundice).  You have a decrease in the amount of urine or are urinating less often.  Your urine turns a dark color or changes to pink, red, or brown. Document Released: 06/09/2000 Document Revised: 09/04/2011 Document Reviewed: 01/27/2008 [facility] Patient Information 2014 [place], [place].  _______________________________________________________________________

## 2015-07-07 ENCOUNTER — Inpatient Hospital Stay (HOSPITAL_COMMUNITY): Payer: 59 | Admitting: Registered Nurse

## 2015-07-07 ENCOUNTER — Inpatient Hospital Stay (HOSPITAL_COMMUNITY): Payer: 59

## 2015-07-07 ENCOUNTER — Encounter (HOSPITAL_COMMUNITY)
Admission: RE | Disposition: A | Discharge status: Home Health | Payer: Self-pay | Source: Ambulatory Visit | Attending: Orthopedic Surgery

## 2015-07-07 ENCOUNTER — Encounter (HOSPITAL_COMMUNITY): Payer: Self-pay

## 2015-07-07 ENCOUNTER — Inpatient Hospital Stay (HOSPITAL_COMMUNITY)
Admission: RE | Admit: 2015-07-07 | Discharge: 2015-07-08 | DRG: 470 | Disposition: A | Discharge status: Home Health | Payer: 59 | Source: Ambulatory Visit | Attending: Orthopedic Surgery | Admitting: Orthopedic Surgery

## 2015-07-07 DIAGNOSIS — K219 Gastro-esophageal reflux disease without esophagitis: Secondary | ICD-10-CM | POA: Diagnosis present

## 2015-07-07 DIAGNOSIS — Z6837 Body mass index (BMI) 37.0-37.9, adult: Secondary | ICD-10-CM | POA: Diagnosis not present

## 2015-07-07 DIAGNOSIS — G8929 Other chronic pain: Secondary | ICD-10-CM | POA: Diagnosis present

## 2015-07-07 DIAGNOSIS — Z791 Long term (current) use of non-steroidal anti-inflammatories (NSAID): Secondary | ICD-10-CM

## 2015-07-07 DIAGNOSIS — Z8261 Family history of arthritis: Secondary | ICD-10-CM | POA: Diagnosis not present

## 2015-07-07 DIAGNOSIS — M5136 Other intervertebral disc degeneration, lumbar region: Secondary | ICD-10-CM | POA: Diagnosis present

## 2015-07-07 DIAGNOSIS — Z01812 Encounter for preprocedural laboratory examination: Secondary | ICD-10-CM | POA: Diagnosis not present

## 2015-07-07 DIAGNOSIS — M1612 Unilateral primary osteoarthritis, left hip: Principal | ICD-10-CM | POA: Diagnosis present

## 2015-07-07 DIAGNOSIS — I10 Essential (primary) hypertension: Secondary | ICD-10-CM | POA: Diagnosis present

## 2015-07-07 DIAGNOSIS — Z91040 Latex allergy status: Secondary | ICD-10-CM | POA: Diagnosis not present

## 2015-07-07 DIAGNOSIS — Z7982 Long term (current) use of aspirin: Secondary | ICD-10-CM | POA: Diagnosis not present

## 2015-07-07 DIAGNOSIS — Z79899 Other long term (current) drug therapy: Secondary | ICD-10-CM

## 2015-07-07 DIAGNOSIS — M25752 Osteophyte, left hip: Secondary | ICD-10-CM | POA: Diagnosis present

## 2015-07-07 DIAGNOSIS — Z886 Allergy status to analgesic agent status: Secondary | ICD-10-CM

## 2015-07-07 DIAGNOSIS — M169 Osteoarthritis of hip, unspecified: Secondary | ICD-10-CM | POA: Diagnosis present

## 2015-07-07 DIAGNOSIS — M25552 Pain in left hip: Secondary | ICD-10-CM | POA: Diagnosis present

## 2015-07-07 DIAGNOSIS — Z96649 Presence of unspecified artificial hip joint: Secondary | ICD-10-CM

## 2015-07-07 HISTORY — PX: TOTAL HIP ARTHROPLASTY: SHX124

## 2015-07-07 LAB — TYPE AND SCREEN
ABO/RH(D): B POS
ANTIBODY SCREEN: NEGATIVE

## 2015-07-07 SURGERY — ARTHROPLASTY, HIP, TOTAL, ANTERIOR APPROACH
Anesthesia: Spinal | Site: Hip | Laterality: Left

## 2015-07-07 MED ORDER — METHOCARBAMOL 500 MG PO TABS: 500.0000 mg | ORAL_TABLET | Freq: Four times a day (QID) | ORAL | Status: DC | PRN

## 2015-07-07 MED ORDER — DEXAMETHASONE SODIUM PHOSPHATE 10 MG/ML IJ SOLN
10.0000 mg | Freq: Once | INTRAMUSCULAR | Status: AC
  Administered 2015-07-08: 10 mg via INTRAVENOUS
  Filled 2015-07-07 (×2): qty 1

## 2015-07-07 MED ORDER — EPHEDRINE SULFATE 50 MG/ML IJ SOLN
INTRAMUSCULAR | Status: DC | PRN
  Administered 2015-07-07 (×3): 5 mg via INTRAVENOUS

## 2015-07-07 MED ORDER — PHENYLEPHRINE HCL 10 MG/ML IJ SOLN
INTRAMUSCULAR | Status: AC
  Filled 2015-07-07: qty 2

## 2015-07-07 MED ORDER — ONDANSETRON HCL 4 MG PO TABS: 4.0000 mg | ORAL_TABLET | Freq: Four times a day (QID) | ORAL | Status: DC | PRN

## 2015-07-07 MED ORDER — HYDROMORPHONE HCL 1 MG/ML IJ SOLN
INTRAMUSCULAR | Status: AC
  Filled 2015-07-07: qty 1

## 2015-07-07 MED ORDER — ACETAMINOPHEN 500 MG PO TABS
1000.0000 mg | ORAL_TABLET | Freq: Four times a day (QID) | ORAL | Status: AC
  Administered 2015-07-07 – 2015-07-08 (×4): 1000 mg via ORAL
  Filled 2015-07-07 (×4): qty 2

## 2015-07-07 MED ORDER — ACETAMINOPHEN 10 MG/ML IV SOLN
INTRAVENOUS | Status: AC
  Filled 2015-07-07: qty 100

## 2015-07-07 MED ORDER — OXYCODONE HCL 5 MG PO TABS
5.0000 mg | ORAL_TABLET | ORAL | Status: DC | PRN
  Administered 2015-07-07 – 2015-07-08 (×6): 10 mg via ORAL
  Filled 2015-07-07 (×5): qty 2

## 2015-07-07 MED ORDER — HYDROMORPHONE HCL 1 MG/ML IJ SOLN: 0.5000 mg | INTRAMUSCULAR | Status: DC | PRN

## 2015-07-07 MED ORDER — TRANEXAMIC ACID 1000 MG/10ML IV SOLN
1000.0000 mg | INTRAVENOUS | Status: AC
  Administered 2015-07-07: 1000 mg via INTRAVENOUS
  Filled 2015-07-07: qty 10

## 2015-07-07 MED ORDER — DEXAMETHASONE SODIUM PHOSPHATE 10 MG/ML IJ SOLN
10.0000 mg | Freq: Once | INTRAMUSCULAR | Status: AC
  Administered 2015-07-07: 10 mg via INTRAVENOUS

## 2015-07-07 MED ORDER — LACTATED RINGERS IV SOLN
INTRAVENOUS | Status: DC | PRN
  Administered 2015-07-07 (×2): via INTRAVENOUS

## 2015-07-07 MED ORDER — HYDROCHLOROTHIAZIDE 12.5 MG PO CAPS
12.5000 mg | ORAL_CAPSULE | Freq: Every day | ORAL | Status: DC
  Administered 2015-07-08: 12.5 mg via ORAL
  Filled 2015-07-07: qty 1

## 2015-07-07 MED ORDER — ONDANSETRON HCL 4 MG/2ML IJ SOLN
INTRAMUSCULAR | Status: AC
  Filled 2015-07-07: qty 2

## 2015-07-07 MED ORDER — POLYETHYLENE GLYCOL 3350 17 G PO PACK
17.0000 g | PACK | Freq: Every day | ORAL | Status: DC | PRN
Start: 2015-07-07 — End: 2015-07-08

## 2015-07-07 MED ORDER — ONDANSETRON HCL 4 MG/2ML IJ SOLN
INTRAMUSCULAR | Status: DC | PRN
  Administered 2015-07-07: 4 mg via INTRAVENOUS

## 2015-07-07 MED ORDER — ACETAMINOPHEN 325 MG PO TABS: 650.0000 mg | ORAL_TABLET | Freq: Four times a day (QID) | ORAL | Status: DC | PRN

## 2015-07-07 MED ORDER — BUPIVACAINE HCL (PF) 0.25 % IJ SOLN
INTRAMUSCULAR | Status: AC
  Filled 2015-07-07: qty 30

## 2015-07-07 MED ORDER — FENTANYL CITRATE (PF) 100 MCG/2ML IJ SOLN
INTRAMUSCULAR | Status: AC
  Filled 2015-07-07: qty 2

## 2015-07-07 MED ORDER — ONDANSETRON HCL 4 MG/2ML IJ SOLN: 4.0000 mg | Freq: Four times a day (QID) | INTRAMUSCULAR | Status: DC | PRN

## 2015-07-07 MED ORDER — MENTHOL 3 MG MT LOZG: 1.0000 | LOZENGE | OROMUCOSAL | Status: DC | PRN

## 2015-07-07 MED ORDER — FENTANYL CITRATE (PF) 100 MCG/2ML IJ SOLN
INTRAMUSCULAR | Status: DC | PRN
  Administered 2015-07-07: 50 ug via INTRAVENOUS

## 2015-07-07 MED ORDER — PHENYLEPHRINE HCL 10 MG/ML IJ SOLN
INTRAMUSCULAR | Status: DC | PRN
  Administered 2015-07-07 (×2): 80 ug via INTRAVENOUS
  Administered 2015-07-07 (×2): 40 ug via INTRAVENOUS
  Administered 2015-07-07: 80 ug via INTRAVENOUS

## 2015-07-07 MED ORDER — AMLODIPINE BESYLATE 5 MG PO TABS
5.0000 mg | ORAL_TABLET | Freq: Every day | ORAL | Status: DC
  Administered 2015-07-08: 5 mg via ORAL
  Filled 2015-07-07: qty 1

## 2015-07-07 MED ORDER — EPHEDRINE SULFATE 50 MG/ML IJ SOLN
INTRAMUSCULAR | Status: AC
  Filled 2015-07-07: qty 1

## 2015-07-07 MED ORDER — PROPOFOL 10 MG/ML IV BOLUS
INTRAVENOUS | Status: AC
  Filled 2015-07-07: qty 40

## 2015-07-07 MED ORDER — ACETAMINOPHEN 650 MG RE SUPP: 650.0000 mg | Freq: Four times a day (QID) | RECTAL | Status: DC | PRN

## 2015-07-07 MED ORDER — PROMETHAZINE HCL 25 MG/ML IJ SOLN: 6.2500 mg | INTRAMUSCULAR | Status: DC | PRN

## 2015-07-07 MED ORDER — PROPOFOL 10 MG/ML IV BOLUS
INTRAVENOUS | Status: AC
  Filled 2015-07-07: qty 20

## 2015-07-07 MED ORDER — METHOCARBAMOL 1000 MG/10ML IJ SOLN
500.0000 mg | Freq: Four times a day (QID) | INTRAVENOUS | Status: DC | PRN
  Administered 2015-07-07: 500 mg via INTRAVENOUS
  Filled 2015-07-07 (×2): qty 5

## 2015-07-07 MED ORDER — 0.9 % SODIUM CHLORIDE (POUR BTL) OPTIME
TOPICAL | Status: DC | PRN
  Administered 2015-07-07: 1000 mL

## 2015-07-07 MED ORDER — PROPOFOL 500 MG/50ML IV EMUL
INTRAVENOUS | Status: DC | PRN
  Administered 2015-07-07: 50 ug/kg/min via INTRAVENOUS

## 2015-07-07 MED ORDER — HYDROMORPHONE HCL 1 MG/ML IJ SOLN
0.2500 mg | INTRAMUSCULAR | Status: DC | PRN
  Administered 2015-07-07 (×5): 0.5 mg via INTRAVENOUS

## 2015-07-07 MED ORDER — DIPHENHYDRAMINE HCL 12.5 MG/5ML PO ELIX: 12.5000 mg | ORAL_SOLUTION | ORAL | Status: DC | PRN

## 2015-07-07 MED ORDER — SODIUM CHLORIDE 0.9 % IJ SOLN
INTRAMUSCULAR | Status: AC
  Filled 2015-07-07: qty 10

## 2015-07-07 MED ORDER — FENTANYL CITRATE (PF) 100 MCG/2ML IJ SOLN
25.0000 ug | INTRAMUSCULAR | Status: DC | PRN
  Administered 2015-07-07 (×2): 50 ug via INTRAVENOUS

## 2015-07-07 MED ORDER — SODIUM CHLORIDE 0.9 % IV SOLN: INTRAVENOUS | Status: DC

## 2015-07-07 MED ORDER — FLEET ENEMA 7-19 GM/118ML RE ENEM: 1.0000 | ENEMA | Freq: Once | RECTAL | Status: DC | PRN

## 2015-07-07 MED ORDER — BUPIVACAINE HCL (PF) 0.25 % IJ SOLN
INTRAMUSCULAR | Status: DC | PRN
  Administered 2015-07-07: 30 mL

## 2015-07-07 MED ORDER — BISACODYL 10 MG RE SUPP: 10.0000 mg | Freq: Every day | RECTAL | Status: DC | PRN

## 2015-07-07 MED ORDER — CEFAZOLIN SODIUM-DEXTROSE 2-3 GM-% IV SOLR
2.0000 g | Freq: Four times a day (QID) | INTRAVENOUS | Status: AC
  Administered 2015-07-07 (×2): 2 g via INTRAVENOUS
  Filled 2015-07-07 (×2): qty 50

## 2015-07-07 MED ORDER — CHLORHEXIDINE GLUCONATE 4 % EX LIQD: 60.0000 mL | Freq: Once | CUTANEOUS | Status: DC

## 2015-07-07 MED ORDER — SODIUM CHLORIDE 0.9 % IV SOLN
INTRAVENOUS | Status: DC
  Administered 2015-07-07: 1000 mL via INTRAVENOUS
  Administered 2015-07-08: 20 mL/h via INTRAVENOUS

## 2015-07-07 MED ORDER — DEXTROSE 5 % IV SOLN
3.0000 g | INTRAVENOUS | Status: AC
  Administered 2015-07-07: 3 g via INTRAVENOUS
  Filled 2015-07-07: qty 3000

## 2015-07-07 MED ORDER — IRBESARTAN 150 MG PO TABS
150.0000 mg | ORAL_TABLET | Freq: Every day | ORAL | Status: DC
  Administered 2015-07-08: 150 mg via ORAL
  Filled 2015-07-07: qty 1

## 2015-07-07 MED ORDER — PHENYLEPHRINE 40 MCG/ML (10ML) SYRINGE FOR IV PUSH (FOR BLOOD PRESSURE SUPPORT)
PREFILLED_SYRINGE | INTRAVENOUS | Status: AC
  Filled 2015-07-07: qty 10

## 2015-07-07 MED ORDER — RIVAROXABAN 10 MG PO TABS
10.0000 mg | ORAL_TABLET | Freq: Every day | ORAL | Status: DC
Start: 2015-07-08 — End: 2015-07-08
  Administered 2015-07-08: 10 mg via ORAL
  Filled 2015-07-07 (×2): qty 1

## 2015-07-07 MED ORDER — PHENYLEPHRINE HCL 10 MG/ML IJ SOLN
20.0000 mg | INTRAMUSCULAR | Status: DC | PRN
  Administered 2015-07-07: 25 ug/min via INTRAVENOUS

## 2015-07-07 MED ORDER — TRAMADOL HCL 50 MG PO TABS: 50.0000 mg | ORAL_TABLET | Freq: Four times a day (QID) | ORAL | Status: DC | PRN

## 2015-07-07 MED ORDER — LIDOCAINE HCL (CARDIAC) 20 MG/ML IV SOLN
INTRAVENOUS | Status: DC | PRN
  Administered 2015-07-07: 100 mg via INTRAVENOUS

## 2015-07-07 MED ORDER — MORPHINE SULFATE (PF) 2 MG/ML IV SOLN: 1.0000 mg | INTRAVENOUS | Status: DC | PRN

## 2015-07-07 MED ORDER — MIDAZOLAM HCL 5 MG/5ML IJ SOLN
INTRAMUSCULAR | Status: DC | PRN
  Administered 2015-07-07: 2 mg via INTRAVENOUS

## 2015-07-07 MED ORDER — ATORVASTATIN CALCIUM 20 MG PO TABS
20.0000 mg | ORAL_TABLET | Freq: Every day | ORAL | Status: DC
  Administered 2015-07-08: 20 mg via ORAL
  Filled 2015-07-07: qty 1

## 2015-07-07 MED ORDER — PHENOL 1.4 % MT LIQD
1.0000 | OROMUCOSAL | Status: DC | PRN
  Filled 2015-07-07: qty 177

## 2015-07-07 MED ORDER — LIDOCAINE HCL (CARDIAC) 20 MG/ML IV SOLN
INTRAVENOUS | Status: AC
  Filled 2015-07-07: qty 5

## 2015-07-07 MED ORDER — BUPIVACAINE IN DEXTROSE 0.75-8.25 % IT SOLN
INTRATHECAL | Status: DC | PRN
  Administered 2015-07-07: 2 mL via INTRATHECAL

## 2015-07-07 MED ORDER — METOCLOPRAMIDE HCL 10 MG PO TABS: 5.0000 mg | ORAL_TABLET | Freq: Three times a day (TID) | ORAL | Status: DC | PRN

## 2015-07-07 MED ORDER — METOCLOPRAMIDE HCL 5 MG/ML IJ SOLN: 5.0000 mg | Freq: Three times a day (TID) | INTRAMUSCULAR | Status: DC | PRN

## 2015-07-07 MED ORDER — LACTATED RINGERS IV SOLN: INTRAVENOUS | Status: DC

## 2015-07-07 MED ORDER — ACETAMINOPHEN 10 MG/ML IV SOLN
1000.0000 mg | Freq: Once | INTRAVENOUS | Status: AC
  Administered 2015-07-07: 1000 mg via INTRAVENOUS

## 2015-07-07 MED ORDER — MIDAZOLAM HCL 2 MG/2ML IJ SOLN
INTRAMUSCULAR | Status: AC
  Filled 2015-07-07: qty 2

## 2015-07-07 MED ORDER — DEXAMETHASONE SODIUM PHOSPHATE 10 MG/ML IJ SOLN
INTRAMUSCULAR | Status: AC
Start: 2015-07-07 — End: 2015-07-07
  Filled 2015-07-07: qty 1

## 2015-07-07 MED ORDER — OXYCODONE HCL 5 MG PO TABS
ORAL_TABLET | ORAL | Status: AC
  Filled 2015-07-07: qty 2

## 2015-07-07 MED ORDER — DOCUSATE SODIUM 100 MG PO CAPS
100.0000 mg | ORAL_CAPSULE | Freq: Two times a day (BID) | ORAL | Status: DC
  Administered 2015-07-07 – 2015-07-08 (×2): 100 mg via ORAL

## 2015-07-07 SURGICAL SUPPLY — 34 items
BAG DECANTER FOR FLEXI CONT (MISCELLANEOUS) ×2 IMPLANT
BAG ZIPLOCK 12X15 (MISCELLANEOUS) IMPLANT
BLADE SAG 18X100X1.27 (BLADE) ×2 IMPLANT
CAPT HIP TOTAL 2 ×2 IMPLANT
CLOTH BEACON ORANGE TIMEOUT ST (SAFETY) ×2 IMPLANT
COVER PERINEAL POST (MISCELLANEOUS) ×2 IMPLANT
DECANTER SPIKE VIAL GLASS SM (MISCELLANEOUS) IMPLANT
DRAPE STERI IOBAN 125X83 (DRAPES) ×2 IMPLANT
DRAPE U-SHAPE 47X51 STRL (DRAPES) ×4 IMPLANT
DRSG ADAPTIC 3X8 NADH LF (GAUZE/BANDAGES/DRESSINGS) ×2 IMPLANT
DRSG MEPILEX BORDER 4X4 (GAUZE/BANDAGES/DRESSINGS) ×2 IMPLANT
DRSG MEPILEX BORDER 4X8 (GAUZE/BANDAGES/DRESSINGS) ×2 IMPLANT
DURAPREP 26ML APPLICATOR (WOUND CARE) ×2 IMPLANT
ELECT REM PT RETURN 9FT ADLT (ELECTROSURGICAL) ×2
ELECTRODE REM PT RTRN 9FT ADLT (ELECTROSURGICAL) ×1 IMPLANT
EVACUATOR 1/8 PVC DRAIN (DRAIN) ×2 IMPLANT
GLOVE BIO SURGEON STRL SZ7.5 (GLOVE) IMPLANT
GLOVE BIO SURGEON STRL SZ8 (GLOVE) ×4 IMPLANT
GLOVE BIOGEL PI IND STRL 7.5 (GLOVE) ×1 IMPLANT
GLOVE BIOGEL PI IND STRL 8 (GLOVE) ×2 IMPLANT
GLOVE BIOGEL PI INDICATOR 7.5 (GLOVE) ×1
GLOVE BIOGEL PI INDICATOR 8 (GLOVE) ×2
GOWN STRL REUS W/TWL LRG LVL3 (GOWN DISPOSABLE) ×2 IMPLANT
GOWN STRL REUS W/TWL XL LVL3 (GOWN DISPOSABLE) ×2 IMPLANT
PACK ANTERIOR HIP CUSTOM (KITS) ×2 IMPLANT
STRIP CLOSURE SKIN 1/2X4 (GAUZE/BANDAGES/DRESSINGS) ×2 IMPLANT
SUT ETHIBOND NAB CT1 #1 30IN (SUTURE) ×2 IMPLANT
SUT MNCRL AB 4-0 PS2 18 (SUTURE) ×2 IMPLANT
SUT VIC AB 2-0 CT1 27 (SUTURE) ×2
SUT VIC AB 2-0 CT1 TAPERPNT 27 (SUTURE) ×2 IMPLANT
SUT VLOC 180 0 24IN GS25 (SUTURE) ×2 IMPLANT
SYR 50ML LL SCALE MARK (SYRINGE) IMPLANT
TRAY FOLEY BAG SILVER LF 16FR (CATHETERS) ×2 IMPLANT
YANKAUER SUCT BULB TIP 10FT TU (MISCELLANEOUS) ×2 IMPLANT

## 2015-07-07 NOTE — Anesthesia Postprocedure Evaluation (Signed)
Anesthesia Post Note  Patient: Jake Clark  Procedure(s) Performed: Procedure(s) (LRB): LEFT TOTAL HIP ARTHROPLASTY ANTERIOR APPROACH (Left)  Patient location during evaluation: PACU Anesthesia Type: Spinal Level of consciousness: awake and alert Pain management: pain level controlled Vital Signs Assessment: post-procedure vital signs reviewed and stable Respiratory status: spontaneous breathing, nonlabored ventilation, respiratory function stable and patient connected to nasal cannula oxygen Cardiovascular status: blood pressure returned to baseline and stable Postop Assessment: no signs of nausea or vomiting Anesthetic complications: no    Last Vitals:  Filed Vitals:   07/07/15 1100 07/07/15 1115  BP: 117/82 118/70  Pulse: 65 76  Temp: 36.3 C   Resp: 19 18    Last Pain:  Filed Vitals:   07/07/15 1132  PainSc: Asleep                 Jeniah Kishi S

## 2015-07-07 NOTE — Progress Notes (Signed)
Danna RN aware pt will be in 1613 in 20 minutes.

## 2015-07-07 NOTE — Discharge Instructions (Addendum)
° °Dr. Frank Aluisio °Total Joint Specialist °[facility] °[address] °[place], [place] [address] °[phone] ° °ANTERIOR APPROACH TOTAL HIP REPLACEMENT POSTOPERATIVE DIRECTIONS ° ° °Hip Rehabilitation, Guidelines Following Surgery  °The results of a hip operation are greatly improved after range of motion and muscle strengthening exercises. Follow all safety measures which are given to protect your hip. If any of these exercises cause increased pain or swelling in your joint, decrease the amount until you are comfortable again. Then slowly increase the exercises. Call your caregiver if you have problems or questions.  ° °HOME CARE INSTRUCTIONS  °Remove items at home which could result in a fall. This includes throw rugs or furniture in walking pathways.  °· ICE to the affected hip every three hours for 30 minutes at a time and then as needed for pain and swelling.  Continue to use ice on the hip for pain and swelling from surgery. You may notice swelling that will progress down to the foot and ankle.  This is normal after surgery.  Elevate the leg when you are not up walking on it.   °· Continue to use the breathing machine which will help keep your temperature down.  It is common for your temperature to cycle up and down following surgery, especially at night when you are not up moving around and exerting yourself.  The breathing machine keeps your lungs expanded and your temperature down. ° ° °DIET °You may resume your previous home diet once your are discharged from the hospital. ° °DRESSING / WOUND CARE / SHOWERING °You may shower 3 days after surgery, but keep the wounds dry during showering.  You may use an occlusive plastic wrap (Press'n Seal for example), NO SOAKING/SUBMERGING IN THE BATHTUB.  If the bandage gets wet, change with a clean dry gauze.  If the incision gets wet, pat the wound dry with a clean towel. °You may start showering once you are discharged home but do not  submerge the incision under water. Just pat the incision dry and apply a dry gauze dressing on daily. °Change the surgical dressing daily and reapply a dry dressing each time. ° °ACTIVITY °Walk with your walker as instructed. °Use walker as long as suggested by your caregivers. °Avoid periods of inactivity such as sitting longer than an hour when not asleep. This helps prevent blood clots.  °You may resume a sexual relationship in one month or when given the OK by your doctor.  °You may return to work once you are cleared by your doctor.  °Do not drive a car for 6 weeks or until released by you surgeon.  °Do not drive while taking narcotics. ° °WEIGHT BEARING °Weight bearing as tolerated with assist device (walker, cane, etc) as directed, use it as long as suggested by your surgeon or therapist, typically at least 4-6 weeks. ° °POSTOPERATIVE CONSTIPATION PROTOCOL °Constipation - defined medically as fewer than three stools per week and severe constipation as less than one stool per week. ° °One of the most common issues patients have following surgery is constipation.  Even if you have a regular bowel pattern at home, your normal regimen is likely to be disrupted due to multiple reasons following surgery.  Combination of anesthesia, postoperative narcotics, change in appetite and fluid intake all can affect your bowels.  In order to avoid complications following surgery, here are some recommendations in order to help you during your recovery period. ° °Colace (docusate) - Pick up an over-the-counter   form of Colace or another stool softener and take twice a day as long as you are requiring postoperative pain medications.  Take with a full glass of water daily.  If you experience loose stools or diarrhea, hold the colace until you stool forms back up.  If your symptoms do not get better within 1 week or if they get worse, check with your doctor. ° °Dulcolax (bisacodyl) - Pick up over-the-counter and take as directed  by the product packaging as needed to assist with the movement of your bowels.  Take with a full glass of water.  Use this product as needed if not relieved by Colace only.  ° °MiraLax (polyethylene glycol) - Pick up over-the-counter to have on hand.  MiraLax is a solution that will increase the amount of water in your bowels to assist with bowel movements.  Take as directed and can mix with a glass of water, juice, soda, coffee, or tea.  Take if you go more than two days without a movement. °Do not use MiraLax more than once per day. Call your doctor if you are still constipated or irregular after using this medication for 7 days in a row. ° °If you continue to have problems with postoperative constipation, please contact the office for further assistance and recommendations.  If you experience "the worst abdominal pain ever" or develop nausea or vomiting, please contact the office immediatly for further recommendations for treatment. ° °ITCHING ° If you experience itching with your medications, try taking only a single pain pill, or even half a pain pill at a time.  You can also use Benadryl over the counter for itching or also to help with sleep.  ° °TED HOSE STOCKINGS °Wear the elastic stockings on both legs for three weeks following surgery during the day but you may remove then at night for sleeping. ° °MEDICATIONS °See your medication summary on the “After Visit Summary” that the nursing staff will review with you prior to discharge.  You may have some home medications which will be placed on hold until you complete the course of blood thinner medication.  It is important for you to complete the blood thinner medication as prescribed by your surgeon.  Continue your approved medications as instructed at time of discharge. ° °PRECAUTIONS °If you experience chest pain or shortness of breath - call 911 immediately for transfer to the hospital emergency department.  °If you develop a fever greater that 101 F,  purulent drainage from wound, increased redness or drainage from wound, foul odor from the wound/dressing, or calf pain - CONTACT YOUR SURGEON.   °                                                °FOLLOW-UP APPOINTMENTS °Make sure you keep all of your appointments after your operation with your surgeon and caregivers. You should call the office at the above phone number and make an appointment for approximately two weeks after the date of your surgery or on the date instructed by your surgeon outlined in the "After Visit Summary". ° °RANGE OF MOTION AND STRENGTHENING EXERCISES  °These exercises are designed to help you keep full movement of your hip joint. Follow your caregiver's or physical therapist's instructions. Perform all exercises about fifteen times, three times per day or as directed. Exercise both hips, even if you   have had only one joint replacement. These exercises can be done on a training (exercise) mat, on the floor, on a table or on a bed. Use whatever works the best and is most comfortable for you. Use music or television while you are exercising so that the exercises are a pleasant break in your day. This will make your life better with the exercises acting as a break in routine you can look forward to.  Lying on your back, slowly slide your foot toward your buttocks, raising your knee up off the floor. Then slowly slide your foot back down until your leg is straight again.  Lying on your back spread your legs as far apart as you can without causing discomfort.  Lying on your side, raise your upper leg and foot straight up from the floor as far as is comfortable. Slowly lower the leg and repeat.  Lying on your back, tighten up the muscle in the front of your thigh (quadriceps muscles). You can do this by keeping your leg straight and trying to raise your heel off the floor. This helps strengthen the largest muscle supporting your knee.  Lying on your back, tighten up the muscles of your  buttocks both with the legs straight and with the knee bent at a comfortable angle while keeping your heel on the floor.   IF YOU ARE TRANSFERRED TO A SKILLED REHAB FACILITY If the patient is transferred to a skilled rehab facility following release from the hospital, a list of the current medications will be sent to the facility for the patient to continue.  When discharged from the skilled rehab facility, please have the facility set up the patient's Home Health Physical Therapy prior to being released. Also, the skilled facility will be responsible for providing the patient with their medications at time of release from the facility to include their pain medication, the muscle relaxants, and their blood thinner medication. If the patient is still at the rehab facility at time of the two week follow up appointment, the skilled rehab facility will also need to assist the patient in arranging follow up appointment in our office and any transportation needs.  MAKE SURE YOU:  Understand these instructions.  Get help right away if you are not doing well or get worse.    Pick up stool softner and laxative for home use following surgery while on pain medications. Do not submerge incision under water. Please use good hand washing techniques while changing dressing each day. May shower starting three days after surgery. Please use a clean towel to pat the incision dry following showers. Continue to use ice for pain and swelling after surgery. Do not use any lotions or creams on the incision until instructed by your surgeon.  Take Xarelto for a total of three weeks, then discontinue Xarelto. Once the patient has completed the Xarelto, they may resume the 81 mg Aspirin.   Information on my medicine - XARELTO (Rivaroxaban)  This medication education was reviewed with me or my healthcare representative as part of my discharge preparation.  The pharmacist that spoke with me during my hospital stay was:   Berkley Harvey, [facility]  Why was Xarelto prescribed for you? Xarelto was prescribed for you to reduce the risk of blood clots forming after orthopedic surgery. The medical term for these abnormal blood clots is venous thromboembolism (VTE).  What do you need to know about xarelto ? Take your Xarelto ONCE DAILY at the same time every day.  You may take it either with or without food.  If you have difficulty swallowing the tablet whole, you may crush it and mix in applesauce just prior to taking your dose.  Take Xarelto exactly as prescribed by your doctor and DO NOT stop taking Xarelto without talking to the doctor who prescribed the medication.  Stopping without other VTE prevention medication to take the place of Xarelto may increase your risk of developing a clot.  After discharge, you should have regular check-up appointments with your healthcare provider that is prescribing your Xarelto.    What do you do if you miss a dose? If you miss a dose, take it as soon as you remember on the same day then continue your regularly scheduled once daily regimen the next day. Do not take two doses of Xarelto on the same day.   Important Safety Information A possible side effect of Xarelto is bleeding. You should call your healthcare provider right away if you experience any of the following: ? Bleeding from an injury or your nose that does not stop. ? Unusual colored urine (red or dark brown) or unusual colored stools (red or black). ? Unusual bruising for unknown reasons. ? A serious fall or if you hit your head (even if there is no bleeding).  Some medicines may interact with Xarelto and might increase your risk of bleeding while on Xarelto. To help avoid this, consult your healthcare provider or pharmacist prior to using any new prescription or non-prescription medications, including herbals, vitamins, non-steroidal anti-inflammatory drugs (NSAIDs) and supplements.  This website  has more information on Xarelto: [email or web address].

## 2015-07-07 NOTE — Anesthesia Preprocedure Evaluation (Addendum)
Anesthesia Evaluation  Patient identified by MRN, date of birth, ID band Patient awake    Reviewed: Allergy & Precautions, NPO status , Patient's Chart, lab work & pertinent test results  Airway Mallampati: II  TM Distance: >3 FB Neck ROM: Full    Dental no notable dental hx.    Pulmonary neg pulmonary ROS,    Pulmonary exam normal breath sounds clear to auscultation       Cardiovascular hypertension, Normal cardiovascular exam Rhythm:Regular Rate:Normal     Neuro/Psych negative neurological ROS  negative psych ROS   GI/Hepatic negative GI ROS, Neg liver ROS, GERD  ,  Endo/Other  Morbid obesity  Renal/GU negative Renal ROS  negative genitourinary   Musculoskeletal negative musculoskeletal ROS (+)   Abdominal   Peds negative pediatric ROS (+)  Hematology negative hematology ROS (+)   Anesthesia Other Findings   Reproductive/Obstetrics negative OB ROS                           Anesthesia Physical Anesthesia Plan  ASA: II  Anesthesia Plan: Spinal   Post-op Pain Management:    Induction: Intravenous  Airway Management Planned: Mask  Additional Equipment:   Intra-op Plan:   Post-operative Plan:   Informed Consent: I have reviewed the patients History and Physical, chart, labs and discussed the procedure including the risks, benefits and alternatives for the proposed anesthesia with the patient or authorized representative who has indicated his/her understanding and acceptance.   Dental advisory given  Plan Discussed with: CRNA and Surgeon  Anesthesia Plan Comments:         Anesthesia Quick Evaluation

## 2015-07-07 NOTE — H&P (View-Only) (Signed)
Jake Clark DOB: [birth year] Married / Language: English / Race: White Male Date of Admission:  07/07/2015 CC:  Left hip pain History of Present Illness The patient is a 60 year old male who comes in for a preoperative History and Physical. The patient is scheduled for a left total hip arthroplasty (anterior) to be performed by Dr. Gus RankinFrank V. Aluisio, [facility] 07-07-[facility]. The patient is a 60 year old male who presented for follow up of their hip. The patient is being followed for their left hip pain and osteoarthritis. They are now months out from intra-articular injection. Symptoms reported include: pain and aching. The patient feels that they are doing poorly and report their pain level to be moderate to severe. The following medication has been used for pain control: antiinflammatory medication (meloxicam). The patient has not gotten any relief of their symptoms with Cortisone injections. Christen BameRonnie said that the hip injection provided very short term benefit. His hip is getting progressively worse over time. Pain is in his groin radiating to his knee. It is limiting what he can and cannot do. He is occasionally getting pain at night. His right hip does not bother him. He is ready to get the left hip fixed. They have been treated conservatively in the past for the above stated problem and despite conservative measures, they continue to have progressive pain and severe functional limitations and dysfunction. They have failed non-operative management including home exercise, medications, and injections. It is felt that they would benefit from undergoing total joint replacement. Risks and benefits of the procedure have been discussed with the patient and they elect to proceed with surgery. There are no active contraindications to surgery such as ongoing infection or rapidly progressive neurological disease.  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 Latex  skin sensitivity  Family History Rheumatoid Arthritis  mother and father  Social History Living situation  live with spouse Marital status  married Illicit drug use  no Drug/Alcohol Rehab (Previously)  no Exercise  Exercises weekly; does running / walking Tobacco use  never smoker Number of flights of stairs before winded  4-5 Pain Contract  no Drug/Alcohol Rehab (Currently)  no Children  1 Current work status  working full time Alcohol use  never consumed alcohol  Medication History AmLODIPine Besylate (5MG  Tablet, Oral) Active. Valsartan (160MG  Tablet, Oral) Active. Lipitor (10MG  Tablet, Oral) Active. Mobic (7.5MG  Tablet, Oral two times daily) Active. HydroCHLOROthiazide (12.5MG  Tablet, Oral) Active. Aspirin (81MG  Tablet Chewable, Oral) Active.  Past Surgical History Vasectomy  Ankle Surgery  left Arthroscopy of Knee  left  Review of Systems General Not Present- Chills, Fatigue, Fever, Memory Loss, Night Sweats, Weight Gain and Weight Loss. Skin Not Present- Eczema, Hives, Itching, Lesions and Rash. HEENT Not Present- Dentures, Double Vision, Headache, Hearing Loss, Tinnitus and Visual Loss. Respiratory Not Present- Allergies, Chronic Cough, Coughing up blood, Shortness of breath at rest and Shortness of breath with exertion. Cardiovascular Not Present- Chest Pain, Difficulty Breathing Lying Down, Murmur, Palpitations, Racing/skipping heartbeats and Swelling. Gastrointestinal Not Present- Abdominal Pain, Bloody Stool, Constipation, Diarrhea, Difficulty Swallowing, Heartburn, Jaundice, Loss of appetitie, Nausea and Vomiting. Male Genitourinary Not Present- Blood in Urine, Discharge, Flank Pain, Incontinence, Painful  Urination, Urgency, Urinary frequency, Urinary Retention, Urinating at Night and Weak  urinary stream. Musculoskeletal Present- Joint Pain. Not Present- Back Pain, Joint Swelling, Morning Stiffness, Muscle Pain, Muscle Weakness and Spasms. Neurological Not Present- Blackout spells, Difficulty with balance, Dizziness, Paralysis, Tremor and Weakness. Psychiatric Not Present- Insomnia.  Vitals Weight: 269 lb Height: 72in Body Surface Area: 2.42 m Body Mass Index: 36.48 kg/m  BP: 142/76 (Sitting, Right Arm, Standard)   Physical Exam  General Mental Status -Alert, cooperative and good historian. General Appearance-pleasant, Not in acute distress. Orientation-Oriented X3. Build & Nutrition-Well nourished and Well developed.  Head and Neck Head-normocephalic, atraumatic . Neck Global Assessment - supple, no bruit auscultated on the right, no bruit auscultated on the left.  Eye Vision-Wears contact lenses. Pupil - Bilateral-Regular and Round. Motion - Bilateral-EOMI.  Chest and Lung Exam Auscultation Breath sounds - clear at anterior chest wall and clear at posterior chest wall. Adventitious sounds - No Adventitious sounds.  Cardiovascular Auscultation Rhythm - Regular rate and rhythm. Heart Sounds - S1 WNL and S2 WNL. Murmurs & Other Heart Sounds - Auscultation of the heart reveals - No Murmurs.  Abdomen Palpation/Percussion Tenderness - Abdomen is non-tender to palpation. Rigidity (guarding) - Abdomen is soft. Auscultation Auscultation of the abdomen reveals - Bowel sounds normal.  Male Genitourinary Note: Not done, not pertinent to present illness   Musculoskeletal Note: On exam, he is alert and oriented, in no apparent distress. His right hip has normal range of motion without discomfort. His left hip flexion is to about 100, no internal rotation, about 20 external rotation, 20 abduction.  RADIOGRAPHS AP pelvis and lateral of the hip and he has  got bone on bone arthritic change with osteophyte formation.  Assessment & Plan Primary osteoarthritis of left hip (Principal Diagnosis) (M16.12)  Note:Surgical Plans: Left Total Hip Replacement - Anterior Approach  Disposition: Home with wife  PCP: Dr. Lysbeth GalasNyland  IV TXA  Anesthesia Issues: None  Signed electronically by Lauraine RinneAlexzandrew L Keerthi Hazell, III PA-C

## 2015-07-07 NOTE — Anesthesia Procedure Notes (Addendum)
Spinal Patient location during procedure: OR Start time: 07/07/2015 8:30 AM End time: 07/07/2015 8:34 AM Staffing Resident/CRNA: Jaydyn Bozzo A Performed by: resident/CRNA  Preanesthetic Checklist Completed: patient identified, site marked, surgical consent, pre-op evaluation, timeout performed, IV checked, risks and benefits discussed and monitors and equipment checked Spinal Block Patient position: sitting Prep: Betadine Patient monitoring: heart rate, continuous pulse ox and blood pressure Approach: midline Location: L2-3 Injection technique: single-shot Needle Needle type: Sprotte  Needle gauge: 24 G Needle length: 10 cm Assessment Sensory level: T4 Additional Notes Pt placed in sitting position for spinal placement. Tolerated procedure well. Spinal kit and spinal needle expiration date checked and verified. One attempt. + CSF, -heme.   Procedure Name: MAC Date/Time: 07/07/2015 8:24 AM Performed by: Carleene Cooper A Pre-anesthesia Checklist: Patient identified, Timeout performed, Emergency Drugs available, Suction available and Patient being monitored Patient Re-evaluated:Patient Re-evaluated prior to inductionOxygen Delivery Method: Simple face mask Dental Injury: Teeth and Oropharynx as per pre-operative assessment

## 2015-07-07 NOTE — Evaluation (Signed)
Physical Therapy Evaluation Patient Details Name: Jake Clark MRN: [id number] DOB: 20-Jul-1955 Today's Date: 07/07/2015   History of Present Illness  L THR  Clinical Impression  Pt s/p L THR presents with decreased L LE strength/ROM and post op pain limiting functional mobility.  Pt should progress well to dc home with family assist and HHPT follow up.    Follow Up Recommendations Home health PT    Equipment Recommendations  Rolling walker with 5" wheels    Recommendations for Other Services OT consult     Precautions / Restrictions Precautions Precautions: Fall Restrictions Weight Bearing Restrictions: No Other Position/Activity Restrictions: WBAT      Mobility  Bed Mobility Overal bed mobility: Needs Assistance Bed Mobility: Supine to Sit     Supine to sit: Min assist     General bed mobility comments: Cues for sequence and use of R LE to self assist  Transfers Overall transfer level: Needs assistance Equipment used: Rolling walker (2 wheeled) Transfers: Sit to/from Stand Sit to Stand: Min assist         General transfer comment: cues for LE management and use of UEs to self assist  Ambulation/Gait Ambulation/Gait assistance: Min assist Ambulation Distance (Feet): 69 Feet Assistive device: Rolling walker (2 wheeled) Gait Pattern/deviations: Step-to pattern;Decreased step length - right;Decreased step length - left;Shuffle;Trunk flexed Gait velocity: decr Gait velocity interpretation: Below normal speed for age/gender General Gait Details: Cues for sequence, posture and position from RW  Stairs            Wheelchair Mobility    Modified Rankin (Stroke Patients Only)       Balance                                             Pertinent Vitals/Pain Pain Assessment: 0-10 Pain Score: 4  Pain Location: L hip Pain Descriptors / Indicators: Aching;Sore Pain Intervention(s): Limited activity within patient's  tolerance;Monitored during session;Premedicated before session;Ice applied    Home Living Family/patient expects to be discharged to:: Private residence Living Arrangements: Spouse/significant other Available Help at Discharge: Family Type of Home: House Home Access: Stairs to enter Entrance Stairs-Rails: Doctor, general practice of Steps: 2 Home Layout: One level Home Equipment: Environmental consultant - 2 wheels      Prior Function Level of Independence: Independent               Hand Dominance        Extremity/Trunk Assessment   Upper Extremity Assessment: Overall WFL for tasks assessed           Lower Extremity Assessment: LLE deficits/detail      Cervical / Trunk Assessment: Normal  Communication   Communication: No difficulties  Cognition Arousal/Alertness: Awake/alert Behavior During Therapy: WFL for tasks assessed/performed Overall Cognitive Status: Within Functional Limits for tasks assessed                      General Comments      Exercises        Assessment/Plan    PT Assessment Patient needs continued PT services  PT Diagnosis Difficulty walking   PT Problem List Decreased strength;Decreased range of motion;Decreased activity tolerance;Decreased mobility;Decreased knowledge of use of DME;Obesity;Pain  PT Treatment Interventions DME instruction;Gait training;Stair training;Functional mobility training;Therapeutic activities;Therapeutic exercise;Patient/family education   PT Goals (Current goals can be found in the Care Plan  section) Acute Rehab PT Goals Patient Stated Goal: Walk without limping PT Goal Formulation: With patient Time For Goal Achievement: 07/10/15 Potential to Achieve Goals: Good    Frequency 7X/week   Barriers to discharge        Co-evaluation               End of Session Equipment Utilized During Treatment: Gait belt Activity Tolerance: Patient tolerated treatment well Patient left: in chair;with call  bell/phone within reach;with family/visitor present Nurse Communication: Mobility status         Time: [id number] PT Time Calculation (min) (ACUTE ONLY): 26 min   Charges:   PT Evaluation $PT Eval Low Complexity: 1 Procedure PT Treatments $Gait Training: 8-22 mins   PT G Codes:        Onell Mcmath 07/07/2015, 4:55 PM

## 2015-07-07 NOTE — Transfer of Care (Signed)
Immediate Anesthesia Transfer of Care Note  Patient: Jake StalkerRonnie T Clark  Procedure(s) Performed: Procedure(s): LEFT TOTAL HIP ARTHROPLASTY ANTERIOR APPROACH (Left)  Patient Location: PACU  Anesthesia Type:Spinal  Level of Consciousness: awake, alert , oriented and patient cooperative  Airway & Oxygen Therapy: Patient Spontanous Breathing and Patient connected to face mask oxygen  Post-op Assessment: Report given to RN and Post -op Vital signs reviewed and stable  Post vital signs: Reviewed and stable  Last Vitals:  Filed Vitals:   07/07/15 0635  BP: 141/76  Pulse: 83  Temp: 36.6 C  Resp: 18    Complications: No apparent anesthesia complications

## 2015-07-07 NOTE — Op Note (Signed)
OPERATIVE REPORT  PREOPERATIVE DIAGNOSIS: Osteoarthritis of the Left hip.   POSTOPERATIVE DIAGNOSIS: Osteoarthritis of the Left  hip.   PROCEDURE: Left total hip arthroplasty, anterior approach.   SURGEON: Ollen GrossFrank Jehu Mccauslin, MD   ASSISTANT: Avel Peacerew Perkins, PA-C  ANESTHESIA:  Spinal  ESTIMATED BLOOD LOSS:-350 ml   DRAINS: Hemovac x1.   COMPLICATIONS: None   CONDITION: PACU - hemodynamically stable.   BRIEF CLINICAL NOTE: Jake Clark is a 60 y.o. male who has advanced end-  stage arthritis of his Left  hip with progressively worsening pain and  dysfunction.The patient has failed nonoperative management and presents for  total hip arthroplasty.   PROCEDURE IN DETAIL: After successful administration of spinal  anesthetic, the traction boots [facility] were placed on both  feet and the patient was [facility] the [facility] bed, boots placed into the leg  holders. The Left hip was then isolated from the perineum with plastic  drapes and prepped and draped in the usual sterile fashion. ASIS and  greater trochanter were marked and a oblique incision was made, starting  at about 1 cm lateral and 2 cm distal to the ASIS and coursing towards  the anterior cortex of the femur. The skin was cut with a 10 blade  through subcutaneous tissue to the level of the fascia overlying the  tensor fascia lata muscle. The fascia was then incised in line with the  incision at the junction of the anterior third and posterior 2/3rd. The  muscle was teased off the fascia and then the interval between the TFL  and the rectus was developed. The Hohmann retractor was then placed at  the top of the femoral neck over the capsule. The vessels overlying the  capsule were cauterized and the fat on top of the capsule was removed.  A Hohmann retractor was then placed anterior underneath the rectus  femoris to give exposure to the entire anterior capsule. A T-shaped  capsulotomy was performed. The  edges were tagged and the femoral head  was identified.       Osteophytes are removed off the superior acetabulum.  The femoral neck was then cut in situ with an oscillating saw. Traction  was then applied to the left lower extremity utilizing the [facility]  traction. The femoral head was then removed. Retractors were placed  around the acetabulum and then circumferential removal of the labrum was  performed. Osteophytes were also removed. Reaming starts at 47 mm to  medialize and  Increased in 2 mm increments to 53 mm. We reamed in  approximately 40 degrees of abduction, 20 degrees anteversion. A 54 mm  pinnacle acetabular shell was then impacted in anatomic position under  fluoroscopic guidance with excellent purchase. We did not need to place  any additional dome screws. A 36 mm neutral + 4 marathon liner was then  placed into the acetabular shell.       The femoral lift was then placed along the lateral aspect of the femur  just distal to the vastus ridge. The leg was  externally rotated and capsule  was stripped off the inferior aspect of the femoral neck down to the  level of the lesser trochanter, this was done with electrocautery. The femur was lifted after this was performed. The  leg was then placed and extended in adducted position to essentially delivering the femur. We also removed the capsule superiorly and the  piriformis from the piriformis  fossa to gain excellent exposure of the  proximal femur. Rongeur was used to remove some cancellous bone to get  into the lateral portion of the proximal femur for placement of the  initial starter reamer. The starter broaches was placed  the starter broach  and was shown to go down the center of the canal. Broaching  with the  Corail system was then performed starting at size 8, coursing  Up to size 12. A size 12 had excellent torsional and rotational  and axial stability. The trial standard offset neck was then placed  with a 36 + 1.5 trial  head. The hip was then reduced. We confirmed that  the stem was in the canal both on AP and lateral x-rays. It also has excellent sizing. The hip was reduced with outstanding stability through full extension, full external rotation,  and then flexion in adduction internal rotation. AP pelvis was taken  and the leg lengths were measured and found to be exactly equal. Hip  was then dislocated again and the femoral head and neck removed. The  femoral broach was removed. Size 12 Corail stem with a standard offset  neck was then impacted into the femur following native anteversion. Has  excellent purchase in the canal. Excellent torsional and rotational and  axial stability. It is confirmed to be in the canal on AP and lateral  fluoroscopic views. The 36 + 1.5 ceramic head was placed and the hip  reduced with outstanding stability. Again AP pelvis was taken and it  confirmed that the leg lengths were equal. The wound was then copiously  irrigated with saline solution and the capsule reattached and repaired  with Ethibond suture. 30 ml of .25% Bupivicaine injected into the capsule and into the edge of the tensor fascia lata as well as subcutaneous tissue. The fascia overlying the tensor fascia lata was  then closed with a running #1 V-Loc. Subcu was closed with interrupted  2-0 Vicryl and subcuticular running 4-0 Monocryl. Incision was cleaned  and dried. Steri-Strips and a bulky sterile dressing applied. Hemovac  drain was hooked to suction and then he was awakened and transported to  recovery in stable condition.        Please note that a surgical assistant was a medical necessity for this procedure to perform it in a safe and expeditious manner. Assistant was necessary to provide appropriate retraction of vital neurovascular structures and to prevent femoral fracture and allow for anatomic placement of the prosthesis.  Gaynelle Arabian, M.D.

## 2015-07-07 NOTE — Progress Notes (Signed)
[facility] Pt alert and oriented, able to communicate needs. Will continue with current plan of care.

## 2015-07-07 NOTE — Interval H&P Note (Signed)
History and Physical Interval Note:  07/07/2015 8:05 AM  Jake Clark  has presented today for surgery, with the diagnosis of OA OF LEFT HIP  The various methods of treatment have been discussed with the patient and family. After consideration of risks, benefits and other options for treatment, the patient has consented to  Procedure(s): LEFT TOTAL HIP ARTHROPLASTY ANTERIOR APPROACH (Left) as a surgical intervention .  The patient's history has been reviewed, patient examined, no change in status, stable for surgery.  I have reviewed the patient's chart and labs.  Questions were answered to the patient's satisfaction.     Loanne DrillingALUISIO,Juwuan Sedita V

## 2015-07-07 NOTE — Progress Notes (Signed)
Hemovac found on Pt's bed with tubing disconnected form incision site. Unit Charge RN notified, output measured and documented on flow sheet.

## 2015-07-08 LAB — BASIC METABOLIC PANEL
ANION GAP: 6 (ref 5–15)
BUN: 14 mg/dL (ref 6–20)
CALCIUM: 8.9 mg/dL (ref 8.9–10.3)
CO2: 26 mmol/L (ref 22–32)
CREATININE: 0.62 mg/dL (ref 0.61–1.24)
Chloride: 107 mmol/L (ref 101–111)
GFR calc Af Amer: >60 mL/min (ref >60)
GLUCOSE: 134 mg/dL — AB (ref 65–99)
Potassium: 4.2 mmol/L (ref 3.5–5.1)
Sodium: 139 mmol/L (ref 135–145)

## 2015-07-08 LAB — CBC
HCT: 39.3 % (ref 39.0–52.0)
HEMOGLOBIN: 13.2 g/dL (ref 13.0–17.0)
MCH: 29.3 pg (ref 26.0–34.0)
MCHC: 33.6 g/dL (ref 30.0–36.0)
MCV: 87.3 fL (ref 78.0–100.0)
PLATELETS: 251 10*3/uL (ref 150–400)
RBC: 4.5 MIL/uL (ref 4.22–5.81)
RDW: 12.4 % (ref 11.5–15.5)
WBC: 11.8 10*3/uL — AB (ref 4.0–10.5)

## 2015-07-08 MED ORDER — TRAMADOL HCL 50 MG PO TABS: 50.0000 mg | ORAL_TABLET | Freq: Four times a day (QID) | ORAL | Status: DC | PRN

## 2015-07-08 MED ORDER — METHOCARBAMOL 500 MG PO TABS: 500.0000 mg | ORAL_TABLET | Freq: Four times a day (QID) | ORAL | Status: DC | PRN

## 2015-07-08 MED ORDER — OXYCODONE HCL 5 MG PO TABS: 5.0000 mg | ORAL_TABLET | ORAL | Status: DC | PRN

## 2015-07-08 MED ORDER — RIVAROXABAN 10 MG PO TABS: 10.0000 mg | ORAL_TABLET | Freq: Every day | ORAL | Status: DC

## 2015-07-08 NOTE — Progress Notes (Signed)
   Subjective: 1 Day Post-Op Procedure(s) (LRB): LEFT TOTAL HIP ARTHROPLASTY ANTERIOR APPROACH (Left) Patient reports pain as mild.   Plan is to go Home after hospital stay.  Objective: Vital signs in last 24 hours: Temp:  [97.3 F (36.3 C)-98.2 F (36.8 C)] 98.2 F (36.8 C) (01/12 0546) Pulse Rate:  [65-98] 81 (01/12 0546) Resp:  [11-22] 18 (01/12 0546) BP: (109-154)/(59-89) 121/89 mmHg (01/12 0546) SpO2:  [95 %-100 %] 98 % (01/12 0546) Weight:  [125.646 kg (277 lb)] 125.646 kg (277 lb) (01/11 0706)  Intake/Output from previous day:  Intake/Output Summary (Last 24 hours) at 07/08/15 0656 Last data filed at 07/08/15 0422  Gross per 24 hour  Intake   4594 ml  Output   1800 ml  Net   2794 ml    Intake/Output this shift: Total I/O In: 1239 [P.O.:600; I.V.:639] Out: 600 [Urine:600]  Labs:  Recent Labs  07/08/15 0410  HGB 13.2    Recent Labs  07/08/15 0410  WBC 11.8*  RBC 4.50  HCT 39.3  PLT 251    Recent Labs  07/08/15 0410  NA 139  K 4.2  CL 107  CO2 26  BUN 14  CREATININE 0.62  GLUCOSE 134*  CALCIUM 8.9   No results for input(s): LABPT, INR in the last 72 hours.  EXAM General - Patient is Alert, Appropriate and Oriented Extremity - Neurologically intact Neurovascular intact No cellulitis present Compartment soft Dressing - dressing C/D/I Motor Function - intact, moving foot and toes well on exam.  Hemovac pulled without difficulty.  Past Medical History  Diagnosis Date  . HTN (hypertension)   . Wears glasses   . GERD (gastroesophageal reflux disease)   . History of hiatal hernia   . Arthritis     Assessment/Plan: 1 Day Post-Op Procedure(s) (LRB): LEFT TOTAL HIP ARTHROPLASTY ANTERIOR APPROACH (Left) Principal Problem:   OA (osteoarthritis) of hip   Advance diet Up with therapy D/C IV fluids Discharge home with home health  DVT Prophylaxis - Xarelto Weight Bearing As Tolerated left Leg Hemovac Pulled Home today- Needs  HHPT  Odell Choung V

## 2015-07-08 NOTE — Evaluation (Signed)
Occupational Therapy Evaluation Patient Details Name: Jake Clark MRN: [id number] DOB: Aug 19, 1955 Today's Date: 07/08/2015    History of Present Illness L THR   Clinical Impression   This 60 year old man was admitted for the above surgery. All education was completed. No further OT is needed at this time    Follow Up Recommendations  No OT follow up    Equipment Recommendations  3 in 1 bedside comode    Recommendations for Other Services       Precautions / Restrictions Precautions Precautions: Fall Restrictions Weight Bearing Restrictions: No Other Position/Activity Restrictions: WBAT      Mobility Bed Mobility               General bed mobility comments: oob  Transfers   Equipment used: Rolling walker (2 wheeled) Transfers: Sit to/from Stand Sit to Stand: Supervision              Balance                                            ADL Overall ADL's : Needs assistance/impaired     Grooming: Supervision/safety;Standing   Upper Body Bathing: Set up;Sitting   Lower Body Bathing: Minimal assistance;Sit to/from stand   Upper Body Dressing : Set up;Sitting   Lower Body Dressing: Moderate assistance;Sit to/from stand   Toilet Transfer: Supervision/safety;Ambulation   Toileting- Clothing Manipulation and Hygiene: Supervision/safety   Tub/ Shower Transfer: Walk-in shower;Supervision/safety;3 in 1;Ambulation     General ADL Comments: wife is available to assist with ADLs as needed (shoes/socks). Pt is not interested in AE; would benefit from long netted sponge on a stick, if he wants to be more independent showering.  Educated on 3:1 placement in shower and wiping legs off after use to prevent rusting     Vision     Perception     Praxis      Pertinent Vitals/Pain Pain Score: 2  Pain Location: L hip Pain Descriptors / Indicators: Sore Pain Intervention(s): Limited activity within patient's tolerance;Monitored  during session;Premedicated before session;Repositioned     Hand Dominance     Extremity/Trunk Assessment Upper Extremity Assessment Upper Extremity Assessment: Overall WFL for tasks assessed           Communication Communication Communication: No difficulties   Cognition Arousal/Alertness: Awake/alert Behavior During Therapy: WFL for tasks assessed/performed Overall Cognitive Status: Within Functional Limits for tasks assessed                     General Comments       Exercises       Shoulder Instructions      Home Living Family/patient expects to be discharged to:: Private residence Living Arrangements: Spouse/significant other                 Bathroom Shower/Tub: Producer, television/film/videoWalk-in shower   Bathroom Toilet: Standard     Home Equipment: Environmental consultantWalker - 2 wheels          Prior Functioning/Environment Level of Independence: Independent             OT Diagnosis: Acute pain   OT Problem List:     OT Treatment/Interventions:      OT Goals(Current goals can be found in the care plan section) Acute Rehab OT Goals Patient Stated Goal: Walk without limping OT Goal Formulation: All assessment and  education complete, DC therapy  OT Frequency:     Barriers to D/C:            Co-evaluation              End of Session    Activity Tolerance: Patient tolerated treatment well Patient left: in bed (EOB with family)   Time: 0981-1914 OT Time Calculation (min): 10 min Charges:  OT General Charges $OT Visit: 1 Procedure OT Evaluation $OT Eval Low Complexity: 1 Procedure G-Codes:    Fallyn Munnerlyn 2015-08-03, 10:36 AM Marica Otter, OTR/L [phone] 2015-08-03

## 2015-07-08 NOTE — Discharge Summary (Signed)
Physician Discharge Summary   Patient ID: Jake Clark MRN: [id number] DOB/AGE: Oct 01, 1955 60 y.o.  Admit date: 07/07/2015 Discharge date: 07-08-2015  Primary Diagnosis: Osteoarthritis of the Left hip.    Admission Diagnoses:  Past Medical History  Diagnosis Date  . HTN (hypertension)   . Wears glasses   . GERD (gastroesophageal reflux disease)   . History of hiatal hernia   . Arthritis    Discharge Diagnoses:   Principal Problem:   OA (osteoarthritis) of hip  Estimated body mass index is 37.56 kg/(m^2) as calculated from the following:   Height as of this encounter: 6' (1.829 m).   Weight as of this encounter: 125.646 kg (277 lb).  Procedure(s) (LRB): LEFT TOTAL HIP ARTHROPLASTY ANTERIOR APPROACH (Left)   Consults: None  HPI: Jake Clark is a 60 y.o. male who has advanced end-  stage arthritis of his Left hip with progressively worsening pain and  dysfunction.The patient has failed nonoperative management and presents for  total hip arthroplasty.   Laboratory Data: Admission on 07/07/2015  Component Date Value Ref Range Status  . WBC 07/08/2015 11.8* 4.0 - 10.5 K/uL Final  . RBC 07/08/2015 4.50  4.22 - 5.81 MIL/uL Final  . Hemoglobin 07/08/2015 13.2  13.0 - 17.0 g/dL Final  . HCT 07/08/2015 39.3  39.0 - 52.0 % Final  . MCV 07/08/2015 87.3  78.0 - 100.0 fL Final  . MCH 07/08/2015 29.3  26.0 - 34.0 pg Final  . MCHC 07/08/2015 33.6  30.0 - 36.0 g/dL Final  . RDW 07/08/2015 12.4  11.5 - 15.5 % Final  . Platelets 07/08/2015 251  150 - 400 K/uL Final  . Sodium 07/08/2015 139  135 - 145 mmol/L Final  . Potassium 07/08/2015 4.2  3.5 - 5.1 mmol/L Final  . Chloride 07/08/2015 107  101 - 111 mmol/L Final  . CO2 07/08/2015 26  22 - 32 mmol/L Final  . Glucose, Bld 07/08/2015 134* 65 - 99 mg/dL Final  . BUN 07/08/2015 14  6 - 20 mg/dL Final  . Creatinine, Ser 07/08/2015 0.62  0.61 - 1.24 mg/dL Final  . Calcium 07/08/2015 8.9  8.9 - 10.3 mg/dL Final  . GFR calc  non Af Amer 07/08/2015 >60  >60 mL/min Final  . GFR calc Af Amer 07/08/2015 >60  >60 mL/min Final   Comment: (NOTE) The eGFR has been calculated using the CKD EPI equation. This calculation has not been validated in all clinical situations. eGFR's persistently <60 mL/min signify possible Chronic Kidney Disease.   Georgiann Hahn gap 07/08/2015 6  5 - 15 Final  Hospital Outpatient Visit on 07/01/2015  Component Date Value Ref Range Status  . aPTT 07/01/2015 28  24 - 37 seconds Final  . WBC 07/01/2015 7.1  4.0 - 10.5 K/uL Final  . RBC 07/01/2015 5.06  4.22 - 5.81 MIL/uL Final  . Hemoglobin 07/01/2015 14.7  13.0 - 17.0 g/dL Final  . HCT 07/01/2015 44.5  39.0 - 52.0 % Final  . MCV 07/01/2015 87.9  78.0 - 100.0 fL Final  . MCH 07/01/2015 29.1  26.0 - 34.0 pg Final  . MCHC 07/01/2015 33.0  30.0 - 36.0 g/dL Final  . RDW 07/01/2015 12.5  11.5 - 15.5 % Final  . Platelets 07/01/2015 273  150 - 400 K/uL Final  . Sodium 07/01/2015 141  135 - 145 mmol/L Final  . Potassium 07/01/2015 3.8  3.5 - 5.1 mmol/L Final  . Chloride 07/01/2015 105  101 - 111 mmol/L  Final  . CO2 07/01/2015 28  22 - 32 mmol/L Final  . Glucose, Bld 07/01/2015 113* 65 - 99 mg/dL Final  . BUN 07/01/2015 11  6 - 20 mg/dL Final  . Creatinine, Ser 07/01/2015 0.64  0.61 - 1.24 mg/dL Final  . Calcium 07/01/2015 9.5  8.9 - 10.3 mg/dL Final  . Total Protein 07/01/2015 7.2  6.5 - 8.1 g/dL Final  . Albumin 07/01/2015 4.2  3.5 - 5.0 g/dL Final  . AST 07/01/2015 19  15 - 41 U/L Final  . ALT 07/01/2015 21  17 - 63 U/L Final  . Alkaline Phosphatase 07/01/2015 116  38 - 126 U/L Final  . Total Bilirubin 07/01/2015 0.9  0.3 - 1.2 mg/dL Final  . GFR calc non Af Amer 07/01/2015 >60  >60 mL/min Final  . GFR calc Af Amer 07/01/2015 >60  >60 mL/min Final   Comment: (NOTE) The eGFR has been calculated using the CKD EPI equation. This calculation has not been validated in all clinical situations. eGFR's persistently <60 mL/min signify possible Chronic  Kidney Disease.   . Anion gap 07/01/2015 8  5 - 15 Final  . Prothrombin Time 07/01/2015 13.2  11.6 - 15.2 seconds Final  . INR 07/01/2015 0.98  0.00 - 1.49 Final  . ABO/RH(D) 07/01/2015 B POS   Final  . Antibody Screen 07/01/2015 NEG   Final  . Sample Expiration 07/01/2015 07/10/2015   Final  . Extend sample reason 07/01/2015 NO TRANSFUSIONS OR PREGNANCY IN THE PAST 3 MONTHS   Final  . Color, Urine 07/01/2015 YELLOW  YELLOW Final  . APPearance 07/01/2015 CLOUDY* CLEAR Final  . Specific Gravity, Urine 07/01/2015 1.017  1.005 - 1.030 Final  . pH 07/01/2015 6.0  5.0 - 8.0 Final  . Glucose, UA 07/01/2015 NEGATIVE  NEGATIVE mg/dL Final  . Hgb urine dipstick 07/01/2015 NEGATIVE  NEGATIVE Final  . Bilirubin Urine 07/01/2015 NEGATIVE  NEGATIVE Final  . Ketones, ur 07/01/2015 NEGATIVE  NEGATIVE mg/dL Final  . Protein, ur 07/01/2015 NEGATIVE  NEGATIVE mg/dL Final  . Nitrite 07/01/2015 NEGATIVE  NEGATIVE Final  . Leukocytes, UA 07/01/2015 TRACE* NEGATIVE Final  . MRSA, PCR 07/01/2015 NEGATIVE  NEGATIVE Final  . Staphylococcus aureus 07/01/2015 NEGATIVE  NEGATIVE Final   Comment:        The Xpert SA Assay (FDA approved for NASAL specimens in patients over 84 years of age), is one component of a comprehensive surveillance program.  Test performance has been validated by [facility] for patients greater than or equal to 52 year old. It is not intended to diagnose infection nor to guide or monitor treatment.   . Squamous Epithelial / LPF 07/01/2015 0-5* NONE SEEN Final  . WBC, UA 07/01/2015 0-5  0 - 5 WBC/hpf Final  . RBC / HPF 07/01/2015 0-5  0 - 5 RBC/hpf Final  . Bacteria, UA 07/01/2015 RARE* NONE SEEN Final  . Casts 07/01/2015 HYALINE CASTS* NEGATIVE Final  . Urine-Other 07/01/2015 MUCOUS PRESENT   Final  . ABO/RH(D) 07/01/2015 B POS   Final     X-Rays:Dg Pelvis Portable  07/07/2015  CLINICAL DATA:  Status post left hip replacement EXAM: PORTABLE PELVIS 1-2 VIEWS COMPARISON:  None.  FINDINGS: Left hip prosthesis is noted. Surgical drain is noted in place. No acute fracture or dislocation is noted. No gross soft tissue abnormality is seen. IMPRESSION: Status post left hip replacement. These results were called by telephone at the time of interpretation on 07/07/2015 at 11:27 am to Maudie Mercury,  the patients nurse, who verbally acknowledged these results. Electronically Signed   By: Inez Catalina M.D.   On: 07/07/2015 11:27   Dg C-arm 1-60 Min-no Report  07/07/2015  CLINICAL DATA: SURGERY C-ARM 1-60 MINUTES Fluoroscopy was utilized by the requesting physician.  No radiographic interpretation.    EKG:No orders found for this or any previous visit.   Hospital Course: Patient was admitted to [facility] and taken to the OR and underwent the above state procedure without complications.  Patient tolerated the procedure well and was later transferred to the recovery room and then to the orthopaedic floor for postoperative care.  They were given PO and IV analgesics for pain control following their surgery.  They were given 24 hours of postoperative antibiotics of  Anti-infectives    Start     Dose/Rate Route Frequency Ordered Stop   07/07/15 1430  ceFAZolin (ANCEF) IVPB 2 g/50 mL premix     2 g 100 mL/hr over 30 Minutes Intravenous Every 6 hours 07/07/15 1304 07/07/15 2120   07/07/15 0715  ceFAZolin (ANCEF) 3 g in dextrose 5 % 50 mL IVPB     3 g 130 mL/hr over 30 Minutes Intravenous On call to O.R. 07/07/15 [id number] 07/07/15 0836     and started on DVT prophylaxis in the form of Xarelto.   PT and OT were ordered for total hip protocol.  The patient was allowed to be WBAT with therapy. Discharge planning was consulted to help with postop disposition and equipment needs.  Patient had a good night on the evening of surgery.  They started to get up OOB with therapy on day one.  Hemovac drain was pulled without difficulty. Dressing was checked on day one and looked good. Patient was seen in  rounds and was ready to go home.  Diet: Cardiac diet Activity:WBAT Follow-up:in 2 weeks Disposition - Home Discharged Condition: good   Discharge Instructions    Call MD / Call 911    Complete by:  As directed   If you experience chest pain or shortness of breath, CALL 911 and be transported to the hospital emergency room.  If you develope a fever above 101 F, pus (white drainage) or increased drainage or redness at the wound, or calf pain, call your surgeon's office.     Change dressing    Complete by:  As directed   You may change your dressing dressing daily with sterile 4 x 4 inch gauze dressing and paper tape.  Do not submerge the incision under water.     Constipation Prevention    Complete by:  As directed   Drink plenty of fluids.  Prune juice may be helpful.  You may use a stool softener, such as Colace (over the counter) 100 mg twice a day.  Use MiraLax (over the counter) for constipation as needed.     Diet - low sodium heart healthy    Complete by:  As directed      Discharge instructions    Complete by:  As directed   Pick up stool softner and laxative for home use following surgery while on pain medications. Do not submerge incision under water. May remove the surgical dressing tomorrow, Friday 07/09/2015, and then apply a dry gauze dressing daily. Please use good hand washing techniques while changing dressing each day. May shower starting three days after surgery starting Saturday 07/10/2015. Please use a clean towel to pat the incision dry following showers. Continue to use ice for  pain and swelling after surgery. Do not use any lotions or creams on the incision until instructed by your surgeon.  Postoperative Constipation Protocol  Constipation - defined medically as fewer than three stools per week and severe constipation as less than one stool per week.  One of the most common issues patients have following surgery is constipation. Even if you have a regular  bowel pattern at home, your normal regimen is likely to be disrupted due to multiple reasons following surgery. Combination of anesthesia, postoperative narcotics, change in appetite and fluid intake all can affect your bowels. In order to avoid complications following surgery, here are some recommendations in order to help you during your recovery period.  Colace (docusate) - Pick up an over-the-counter form of Colace or another stool softener and take twice a day as long as you are requiring postoperative pain medications. Take with a full glass of water daily. If you experience loose stools or diarrhea, hold the colace until you stool forms back up. If your symptoms do not get better within 1 week or if they get worse, check with your doctor.  Dulcolax (bisacodyl) - Pick up over-the-counter and take as directed by the product packaging as needed to assist with the movement of your bowels. Take with a full glass of water. Use this product as needed if not relieved by Colace only.   MiraLax (polyethylene glycol) - Pick up over-the-counter to have on hand. MiraLax is a solution that will increase the amount of water in your bowels to assist with bowel movements. Take as directed and can mix with a glass of water, juice, soda, coffee, or tea. Take if you go more than two days without a movement. Do not use MiraLax more than once per day. Call your doctor if you are still constipated or irregular after using this medication for 7 days in a row.  If you continue to have problems with postoperative constipation, please contact the office for further assistance and recommendations. If you experience "the worst abdominal pain ever" or develop nausea or vomiting, please contact the office immediatly for further recommendations for treatment.  Take Xarelto for a total of three weeks, then discontinue Xarelto. Once the patient has completed the Xarelto, they may resume the 81 mg Aspirin.      Do not  sit on low chairs, stoools or toilet seats, as it may be difficult to get up from low surfaces    Complete by:  As directed      Driving restrictions    Complete by:  As directed   No driving until released by the physician.     Increase activity slowly as tolerated    Complete by:  As directed      Lifting restrictions    Complete by:  As directed   No lifting until released by the physician.     Patient may shower    Complete by:  As directed   You may shower without a dressing once there is no drainage.  Do not wash over the wound.  If drainage remains, do not shower until drainage stops.     TED hose    Complete by:  As directed   Use stockings (TED hose) for 3 weeks on both leg(s).  You may remove them at night for sleeping.     Weight bearing as tolerated    Complete by:  As directed   Laterality:  left  Extremity:  Lower  Medication List    STOP taking these medications        aspirin EC 81 MG tablet     meloxicam 7.5 MG tablet  Commonly known as:  MOBIC      TAKE these medications        amLODipine 5 MG tablet  Commonly known as:  NORVASC  Take 5 mg by mouth daily.     atorvastatin 20 MG tablet  Commonly known as:  LIPITOR  Take 20 mg by mouth daily.     clobetasol cream 0.05 %  Commonly known as:  TEMOVATE  Apply 1 application topically 2 (two) times daily as needed. For rash. (not to face, groin, or underarms)     docusate sodium 100 MG capsule  Commonly known as:  COLACE  Take 100 mg by mouth 2 (two) times daily.     hydrochlorothiazide 12.5 MG capsule  Commonly known as:  MICROZIDE  Take 12.5 mg by mouth daily.     methocarbamol 500 MG tablet  Commonly known as:  ROBAXIN  Take 1 tablet (500 mg total) by mouth every 6 (six) hours as needed for muscle spasms.     oxyCODONE 5 MG immediate release tablet  Commonly known as:  Oxy IR/ROXICODONE  Take 1-2 tablets (5-10 mg total) by mouth every 3 (three) hours as needed for moderate pain or  severe pain.     rivaroxaban 10 MG Tabs tablet  Commonly known as:  XARELTO  Take 1 tablet (10 mg total) by mouth daily with breakfast. Take Xarelto for two and a half more weeks, then discontinue Xarelto. Once the patient has completed the Xarelto, they may resume the 81 mg Aspirin.     traMADol 50 MG tablet  Commonly known as:  ULTRAM  Take 1-2 tablets (50-100 mg total) by mouth every 6 (six) hours as needed (mild pain).     valsartan 160 MG tablet  Commonly known as:  DIOVAN  Take 160 mg by mouth daily.           Follow-up Information    Follow up with Gearlean Alf, MD. Schedule an appointment as soon as possible for a visit on 07/20/2015.   Specialty:  Orthopedic Surgery   Why:  Call office at [phone] to setup appointment on Tuesday 07/20/2015 with Dr. Wynelle Link.   Contact information:   [address] [place] [address] [phone]       Signed: Arlee Muslim, PA-C Orthopaedic Surgery 07/08/2015, 7:26 AM

## 2015-07-08 NOTE — Progress Notes (Signed)
Physical Therapy Treatment Patient Details Name: Jake StalkerRonnie T Asebedo MRN: [id number] DOB: 1955-10-28 Today's Date: 07/08/2015    History of Present Illness L THR    PT Comments    Pt progressing well with mobility and eager for return home  Follow Up Recommendations  Home health PT     Equipment Recommendations  Rolling walker with 5" wheels    Recommendations for Other Services OT consult     Precautions / Restrictions Precautions Precautions: Fall Restrictions Weight Bearing Restrictions: No Other Position/Activity Restrictions: WBAT    Mobility  Bed Mobility Overal bed mobility: Needs Assistance Bed Mobility: Sit to Supine       Sit to supine: Min guard;Supervision   General bed mobility comments: cues for sequence and use of R LE to self assist  Transfers Overall transfer level: Needs assistance Equipment used: Rolling walker (2 wheeled) Transfers: Sit to/from Stand Sit to Stand: Supervision         General transfer comment: cues for LE management and use of UEs to self assist  Ambulation/Gait Ambulation/Gait assistance: Min guard;Supervision Ambulation Distance (Feet): 180 Feet Assistive device: Rolling walker (2 wheeled) Gait Pattern/deviations: Step-to pattern;Step-through pattern;Decreased step length - right;Decreased step length - left;Shuffle;Trunk flexed Gait velocity: decr Gait velocity interpretation: Below normal speed for age/gender General Gait Details: Cues for sequence, posture and position from RW   Stairs Stairs: Yes Stairs assistance: Min assist Stair Management: No rails;Backwards;With walker;Step to pattern Number of Stairs: 4 General stair comments: 2 stairs twice with spouse assisting on second attempt; cues for sequence and foot/RW placement  Wheelchair Mobility    Modified Rankin (Stroke Patients Only)       Balance                                    Cognition Arousal/Alertness:  Awake/alert Behavior During Therapy: WFL for tasks assessed/performed Overall Cognitive Status: Within Functional Limits for tasks assessed                      Exercises Total Joint Exercises Ankle Circles/Pumps: AROM;Both;15 reps;Supine Quad Sets: AROM;Both;10 reps;Supine Heel Slides: AAROM;Left;20 reps;Supine Hip ABduction/ADduction: AAROM;Left;15 reps;Supine Long Arc Quad: AAROM;AROM;Left;10 reps;Supine    General Comments        Pertinent Vitals/Pain Pain Assessment: 0-10 Pain Score: 3  Pain Location: L hip Pain Descriptors / Indicators: Aching;Sore Pain Intervention(s): Limited activity within patient's tolerance;Monitored during session;Premedicated before session    Home Living Family/patient expects to be discharged to:: Private residence Living Arrangements: Spouse/significant other           Home Equipment: Environmental consultantWalker - 2 wheels      Prior Function Level of Independence: Independent          PT Goals (current goals can now be found in the care plan section) Acute Rehab PT Goals Patient Stated Goal: Walk without limping PT Goal Formulation: With patient Time For Goal Achievement: 07/10/15 Potential to Achieve Goals: Good Progress towards PT goals: Progressing toward goals    Frequency  7X/week    PT Plan Current plan remains appropriate    Co-evaluation             End of Session Equipment Utilized During Treatment: Gait belt Activity Tolerance: Patient tolerated treatment well Patient left: in chair;with call bell/phone within reach;with family/visitor present     Time: [id number] PT Time Calculation (min) (ACUTE ONLY): 29 min  Charges:  $  Gait Training: 8-22 mins $Therapeutic Exercise: 8-22 mins                    G Codes:      Elianie Hubers 10-Jul-2015, 12:40 PM

## 2015-07-08 NOTE — Care Management Note (Signed)
Case Management Note  Patient Details  Name: Jake Clark MRN: [id number] Date of Birth: Nov 27, 1955  Subjective/Objective:   60 y.o. M admitted 07/07/2015 for L THA.  To be discharged to private residence with spouse with HHPT, pt and spouse choose [facility]. Made Edwena FeltyK. Nussbaum aware,  as well as, [place] to deliver DME, prior to discharge.                  Action/Plan: Anticipate discharge home today. No further CM needs but will be available should additional discharge needs arise.   Expected Discharge Date:                  Expected Discharge Plan:  Home w Home Health Services  In-House Referral:     Discharge planning Services  CM Consult  Post Acute Care Choice:  Durable Medical Equipment, Home Health Choice offered to:  Patient, Spouse  DME Arranged:  3-N-1, Walker rolling DME Agency:  [facility]  HH Arranged:  PT HH Agency:  [facility]  Status of Service:  Completed, signed off  Medicare Important Message Given:    Date Medicare IM Given:    Medicare IM give by:    Date Additional Medicare IM Given:    Additional Medicare Important Message give by:     If discussed at Long Length of Stay Meetings, dates discussed:    Additional Comments:  Yvone NeuCrutchfield, Madonna Flegal M, RN 07/08/2015, 9:16 AM

## 2015-07-08 NOTE — Progress Notes (Signed)
Pt to d/c home with [facility]. DME delivered to room prior to d/c (3n1 and RW). AVS reviewed and "My Chart" discussed with pt. Pt capable of verbalizing medications, dressing changes, signs and symptoms of infection, and follow-up appointments. Prescriptions given to patient. Remains hemodynamically stable. No signs and symptoms of distress. Educated pt to return to ER in the case of SOB, dizziness, or chest pain.

## 2017-01-04 ENCOUNTER — Ambulatory Visit (HOSPITAL_COMMUNITY): Payer: 59 | Attending: Family Medicine

## 2017-01-04 ENCOUNTER — Encounter (HOSPITAL_COMMUNITY): Payer: Self-pay

## 2017-01-04 DIAGNOSIS — R293 Abnormal posture: Secondary | ICD-10-CM | POA: Diagnosis present

## 2017-01-04 DIAGNOSIS — G8929 Other chronic pain: Secondary | ICD-10-CM | POA: Diagnosis present

## 2017-01-04 DIAGNOSIS — M25512 Pain in left shoulder: Secondary | ICD-10-CM | POA: Insufficient documentation

## 2017-01-04 DIAGNOSIS — R29898 Other symptoms and signs involving the musculoskeletal system: Secondary | ICD-10-CM

## 2017-01-04 NOTE — Patient Instructions (Signed)
  WAND EXTERNAL ROTATION - SUPINE ER  Lie on your back holding a cane or wand with both hands.   On the affected side, place a small rolled up towel or pillow under your elbow. Maintain approx. 90 degree bend at the elbow with your arm approximately 30-45 degrees away from your side.   Use your other arm to pull the wand/cane to rotate the affected arm back into a stretch. Hold and then return to starting position and then repeat.   Perform 1-2x/day, 2-3 sets of 10, holding for 3-5 seconds    INTERNAL ROTATION TOWEL STRETCH - IR TOWEL  Gently pull up your affected arm behind your back with the assist of a towel  Perform 1-2x/day, 2-3 sets of 10, holding for 3-5 seconds

## 2017-01-04 NOTE — Therapy (Signed)
[facility] [address] [place], [place], [address] Phone: [phone]   Fax:  [phone]  Physical Therapy Evaluation  Patient Details  Name: Jake Clark MRN: [id number] Date of Birth: Apr 04, 1956 Referring Provider: Joette Catching, MD  Encounter Date: 01/04/2017      PT End of Session - 01/04/17 1533    Visit Number 1   Number of Visits 13   Date for PT Re-Evaluation 01/25/17   Authorization Type [employer]   Authorization Time Period 01/04/17 to 02/15/17   PT Start Time 1435   PT Stop Time 1517   PT Time Calculation (min) 42 min   Activity Tolerance Patient tolerated treatment well;No increased pain   Behavior During Therapy WFL for tasks assessed/performed      Past Medical History:  Diagnosis Date  . Arthritis   . GERD (gastroesophageal reflux disease)   . History of hiatal hernia   . HTN (hypertension)   . Wears glasses     Past Surgical History:  Procedure Laterality Date  . ANKLE FRACTURE SURGERY     left / 2005   . KNEE SURGERY     scoped 1994/left   . TOTAL HIP ARTHROPLASTY Left 07/07/2015   Procedure: LEFT TOTAL HIP ARTHROPLASTY ANTERIOR APPROACH;  Surgeon: Ollen Gross, MD;  Location: [facility];  Service: Orthopedics;  Laterality: Left;  Marland Kitchen VASECTOMY      There were no vitals filed for this visit.       Subjective Assessment - 01/04/17 1440    Subjective Pt states that he has been having L shoulder pain for 6 months. He does not remember any trauma to the area. He states that he has arthritis pretty bad in his thumbs, but he thinks this pain feels more like a muscle or tear. It is located primarily on the lateral aspect of his shoudler. It can be sharp or burning when he moves it the wrong way. He has a little neck pain but no significant trauma to that either. 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 He does have some gripping issues but  that's been going on since before the shoulder pain. He states that when he tries to wash or scratch his back he has increased pain, driving is mildly difficult. The most difficult thing for him to do is reach behind his back with the L arm.    Limitations Lifting   How long can you sit comfortably? no issues   How long can you stand comfortably? no issues   How long can you walk comfortably? no issues   Patient Stated Goals get ROM back   Currently in Pain? No/denies            [facility] PT Assessment - 01/04/17 0001      Assessment   Medical Diagnosis L shoulder pain   Referring Provider Joette Catching, MD   Onset Date/Surgical Date 07/07/16   Hand Dominance Right   Prior Therapy none for present issue     Balance Screen   Has the patient fallen in the past 6 months No   Has the patient had a decrease in activity level because of a fear of falling?  No   Is the patient reluctant to leave their home because of a fear of falling?  No     Prior Function   Level of Independence Independent;Independent with basic ADLs  Vocation Full time employment   Vocation Requirements some lifting, pulling   Leisure travel, outside yard games     Sensation   Light Touch Appears Intact     Posture/Postural Control   Posture/Postural Control Postural limitations   Postural Limitations Rounded Shoulders;Forward head;Increased thoracic kyphosis     ROM / Strength   AROM / PROM / Strength AROM;Strength;PROM     AROM   Overall AROM Comments Cervical AROM WFL and no change in L shoulder symptoms   Right Shoulder Flexion --  WNL   Right Shoulder ABduction --  WNL   Right Shoulder Internal Rotation --  T7   Right Shoulder External Rotation --  T4   Left Shoulder Flexion 130 Degrees  pain   Left Shoulder ABduction 118 Degrees  pain   Left Shoulder Internal Rotation --  L5, painful   Left Shoulder External Rotation --  T3     PROM   Overall PROM Comments non-painful throughout PROM,  only minimal pain at end range   Right Shoulder Flexion 150 Degrees   Right Shoulder ABduction 118 Degrees   Right Shoulder Internal Rotation 83 Degrees   Right Shoulder External Rotation 53 Degrees     Strength   Right Shoulder Flexion 5/5   Right Shoulder ABduction 5/5   Right Shoulder Internal Rotation 5/5   Right Shoulder External Rotation 5/5   Left Shoulder Flexion 5/5   Left Shoulder ABduction 4+/5  pain   Left Shoulder Internal Rotation 5/5   Left Shoulder External Rotation 5/5   Right Elbow Flexion 5/5   Right Elbow Extension 5/5   Left Elbow Flexion 5/5   Left Elbow Extension 5/5   Right Wrist Flexion 5/5   Right Wrist Extension 5/5   Left Wrist Flexion 5/5   Left Wrist Extension 5/5   Right Hand Gross Grasp Functional   Left Hand Gross Grasp Functional     Palpation   Spinal mobility hypomobile through gross assessment and postural deficits   Palpation comment increased soft tissue restrictions of L biceps long head tendon and deltoid; mild scapular dyskinesis on L with elevation     Special Tests    Special Tests Rotator Cuff Impingement   Rotator Cuff Impingment tests Neer impingement test;Hawkins- Kennedy test;Lift- off test;Belly Press;Empty Can test;Full Can test;Drop Arm test     Neer Impingement test    Findings Positive   Side Left   Comments decreased pain and increased AROM for flexion and abd     Hawkins-Kennedy test   Findings Negative   Side Left     Lift-Off test   Findings Negative   Side Left     Belly Press   Findings Negative   Side Left     Empty Can test   Findings Negative   Side Left     Full Can test   Findings Negative   Side Left     Drop Arm test   Findings Negative   Side Left        Objective measurements completed on examination: See above findings.          PT Education - 01/04/17 1520    Education provided Yes   Education Details exam findings, POC, HEP   Person(s) Educated Patient   Methods  Explanation;Demonstration;Handout   Comprehension Verbalized understanding;Returned demonstration          PT Short Term Goals - 01/04/17 1608      PT SHORT TERM GOAL #1  Title Pt will be independent with HEP and perform consistently in order to maximize return to PLOF and decrease risk for reinjury.   Time 3   Period Weeks   Status New     PT SHORT TERM GOAL #2   Title Pt will have improved L shoulder IR AROM to at least T10 to maximize his ability to dress and bathe.    Time 3   Period Weeks   Status New           PT Long Term Goals - 01/04/17 1610      PT LONG TERM GOAL #1   Title Pt will have improved L shoulder flexion and abd AROM to at least 150 deg with minimal to no pain in order to maximize his function at home and at work.    Time 6   Period Weeks   Status New     PT LONG TERM GOAL #2   Title Pt will have an increased L ER PROM to 70 deg in order to maximize OH movements and to decrease pain.    Time 6   Period Weeks   Status New     PT LONG TERM GOAL #3   Title Pt will be able to attain and maintain proper sitting posture 75% of the time to decrease pain and maximize overall function as well as decrease risk for reinjury.   Time 6   Period Weeks   Status New                Plan - 01/04/17 1533    Clinical Impression Statement Pt is pleasant 61 YO M who presents to OPPT with c/o L shoulder pain. He denies any trauma, stating that it came on insidiously about 6 months ago. Pt has increased pain, deficits in AROM and PROM, mild scapular dyskinesis on the L, and deficits in posture. Pt had positive Neer's sign on the L indicating poor scapular stabilization and impingement. Pt negative for all RTC special tests. Pt has difficulty performing functional tasks due to his L shoulder impairments and would benefit from skilled PT intervention to decrease pain and maximize ROM to promote return to PLOF.    History and Personal Factors relevant to plan of  care: young, motivated, first time issue with L shoulder, arthritis, HTN, job duties   Clinical Presentation Stable   Clinical Presentation due to: decreased AROM, decreased PROM, abnormal posture   Clinical Decision Making Low   Rehab Potential Good   PT Frequency 2x / week   PT Duration 6 weeks   PT Treatment/Interventions ADLs/Self Care Home Management;Cryotherapy;Electrical Stimulation;Moist Heat;Therapeutic activities;Therapeutic exercise;Neuromuscular re-education;Patient/family education;Manual techniques;Passive range of motion;Dry needling;Taping   PT Next Visit Plan review goals, L shoulder anterior and inferior joint mobs, ER PROM, manual to L shoulder musculature, supine AAROM with wand, pec stretch and biceps stretch in doorway, postural education and strengthening, thoracic mobility   PT Home Exercise Plan eval: ER with wand, IR stretch with towel   Consulted and Agree with Plan of Care Patient      Patient will benefit from skilled therapeutic intervention in order to improve the following deficits and impairments:  Decreased range of motion, Hypomobility, Increased muscle spasms, Impaired flexibility, Impaired UE functional use, Postural dysfunction, Pain, Obesity  Visit Diagnosis: Chronic left shoulder pain - Plan: PT plan of care cert/re-cert  Abnormal posture - Plan: PT plan of care cert/re-cert  Other symptoms and signs involving the musculoskeletal system - Plan: PT  plan of care cert/re-cert     Problem List Patient Active Problem List   Diagnosis Date Noted  . OA (osteoarthritis) of hip 07/07/2015  . Plica of knee 05/29/2011  . Torn meniscus 05/23/2011  . KNEE, ARTHRITIS, DEGEN./OSTEO 04/25/2010  . SCIATICA 04/25/2010     Jac Canavan PT, DPT  [facility] [address] [place], [place], [address] Phone: [phone]   Fax:  [phone]  Name: Jake Clark MRN: [id number] Date of Birth: 12-07-1955

## 2017-01-08 ENCOUNTER — Ambulatory Visit (HOSPITAL_COMMUNITY): Payer: 59 | Admitting: Physical Therapy

## 2017-01-08 DIAGNOSIS — M25512 Pain in left shoulder: Secondary | ICD-10-CM | POA: Diagnosis not present

## 2017-01-08 DIAGNOSIS — G8929 Other chronic pain: Secondary | ICD-10-CM

## 2017-01-08 DIAGNOSIS — R29898 Other symptoms and signs involving the musculoskeletal system: Secondary | ICD-10-CM

## 2017-01-08 DIAGNOSIS — R293 Abnormal posture: Secondary | ICD-10-CM

## 2017-01-08 NOTE — Therapy (Signed)
[facility] [address] [place], [place], [address] Phone: [phone]   Fax:  [phone]  Physical Therapy Treatment  Patient Details  Name: Jake Clark MRN: [id number] Date of Birth: Jun 15, 1956 Referring Provider: Joette Catching, MD  Encounter Date: 01/08/2017      PT End of Session - 01/08/17 1737    Visit Number 2   Number of Visits 13   Date for PT Re-Evaluation 01/25/17   Authorization Type [employer]   Authorization Time Period 01/04/17 to 02/15/17   PT Start Time 1650   PT Stop Time 1728   PT Time Calculation (min) 38 min   Activity Tolerance Patient tolerated treatment well   Behavior During Therapy [facility] for tasks assessed/performed      Past Medical History:  Diagnosis Date  . Arthritis   . GERD (gastroesophageal reflux disease)   . History of hiatal hernia   . HTN (hypertension)   . Wears glasses     Past Surgical History:  Procedure Laterality Date  . ANKLE FRACTURE SURGERY     left / 2005   . KNEE SURGERY     scoped 1994/left   . TOTAL HIP ARTHROPLASTY Left 07/07/2015   Procedure: LEFT TOTAL HIP ARTHROPLASTY ANTERIOR APPROACH;  Surgeon: Ollen Gross, MD;  Location: [facility];  Service: Orthopedics;  Laterality: Left;  Marland Kitchen VASECTOMY      There were no vitals filed for this visit.      Subjective Assessment - 01/08/17 1651    Subjective Patient arrives stating that he is sore, his HEP has been making him sore as well. 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    Patient Stated Goals get ROM back   Currently in Pain? Yes   Pain Score 3    Pain Location Shoulder   Pain Orientation Left   Pain Descriptors / Indicators Sore;Nagging   Pain Type Chronic pain   Pain Radiating Towards sometimes down shoulder blade    Pain Onset More than a month ago   Pain Frequency Constant   Aggravating Factors  IR stretch,k getting out of car, ABD    Pain Relieving Factors not a lot    Effect of Pain on Daily Activities moderate impact                          [facility] Adult PT Treatment/Exercise - 01/08/17 0001      Exercises   Exercises Shoulder     Shoulder Exercises: Supine   External Rotation --   External Rotation Limitations --   Flexion Left;10 reps;AAROM   Flexion Limitations 10 second holds    ABduction Left;10 reps   ABduction Limitations 10 second holds      Shoulder Exercises: Seated   Other Seated Exercises thoracic excursions 1x15 all directions      Shoulder Exercises: Standing   Extension Strengthening;Both;15 reps   Theraband Level (Shoulder Extension) Level 2 (Red)   Retraction Both;15 reps   Theraband Level (Shoulder Retraction) Level 2 (Red)     Shoulder Exercises: Stretch   Corner Stretch 3 reps;30 seconds     Manual Therapy   Manual Therapy Joint mobilization   Manual therapy comments performed separately from all other skilled serrvices    Joint Mobilization anterior and inferior L glenohumeral mobilization grade III x4 rounds each  PT Education - 01/08/17 1736    Education provided Yes   Education Details review of initial eval/goals, HEP updates. possible soreness following new interventions today/management strategies    Person(s) Educated Patient   Methods Explanation;Handout;Demonstration   Comprehension Verbalized understanding;Returned demonstration          PT Short Term Goals - 01/04/17 1608      PT SHORT TERM GOAL #1   Title Pt will be independent with HEP and perform consistently in order to maximize return to PLOF and decrease risk for reinjury.   Time 3   Period Weeks   Status New     PT SHORT TERM GOAL #2   Title Pt will have improved L shoulder IR AROM to at least T10 to maximize his ability to dress and bathe.    Time 3   Period Weeks   Status New           PT Long Term Goals - 01/04/17 1610      PT LONG TERM GOAL #1    Title Pt will have improved L shoulder flexion and abd AROM to at least 150 deg with minimal to no pain in order to maximize his function at home and at work.    Time 6   Period Weeks   Status New     PT LONG TERM GOAL #2   Title Pt will have an increased L ER PROM to 70 deg in order to maximize OH movements and to decrease pain.    Time 6   Period Weeks   Status New     PT LONG TERM GOAL #3   Title Pt will be able to attain and maintain proper sitting posture 75% of the time to decrease pain and maximize overall function as well as decrease risk for reinjury.   Time 6   Period Weeks   Status New               Plan - 01/08/17 1737    Clinical Impression Statement Reviewed initial eval and goals then proceeded with shoulder joint mobilization, AAROM, thoracic mobility, and postural strengthening as indicated in POC. Patient demonstrates improved ROM as interventions continue however continues to demonstrate impaired posture and shoulder ROM at end of session today. Discussed importance of continuing with ROM/mobility based HEP to optimize outcomes from skilled PT services.    Rehab Potential Good   PT Frequency 2x / week   PT Duration 6 weeks   PT Treatment/Interventions ADLs/Self Care Home Management;Cryotherapy;Electrical Stimulation;Moist Heat;Therapeutic activities;Therapeutic exercise;Neuromuscular re-education;Patient/family education;Manual techniques;Passive range of motion;Dry needling;Taping   PT Next Visit Plan continue joint mobilization to shoulder (anterior and inferior mobs), ER ROM, AAROM. Manual PRN. More aggressive scapular control.    PT Home Exercise Plan eval: ER with wand, IR stretch with towel; 7/16: 3D thoracic excursions    Consulted and Agree with Plan of Care Patient      Patient will benefit from skilled therapeutic intervention in order to improve the following deficits and impairments:  Decreased range of motion, Hypomobility, Increased muscle  spasms, Impaired flexibility, Impaired UE functional use, Postural dysfunction, Pain, Obesity  Visit Diagnosis: Chronic left shoulder pain  Abnormal posture  Other symptoms and signs involving the musculoskeletal system     Problem List Patient Active Problem List   Diagnosis Date Noted  . OA (osteoarthritis) of hip 07/07/2015  . Plica of knee 05/29/2011  . Torn meniscus 05/23/2011  . KNEE, ARTHRITIS, DEGEN./OSTEO 04/25/2010  . SCIATICA  04/25/2010    Nedra HaiKristen Unger PT, DPT [phone]  [facility] [address] [place], [place], [address] Phone: [phone]   Fax:  [phone]  Name: Jake Clark MRN: [id number] Date of Birth: 02-18-1956

## 2017-01-11 ENCOUNTER — Ambulatory Visit (HOSPITAL_COMMUNITY): Payer: 59 | Admitting: Physical Therapy

## 2017-01-11 DIAGNOSIS — R293 Abnormal posture: Secondary | ICD-10-CM

## 2017-01-11 DIAGNOSIS — R29898 Other symptoms and signs involving the musculoskeletal system: Secondary | ICD-10-CM

## 2017-01-11 DIAGNOSIS — M25512 Pain in left shoulder: Principal | ICD-10-CM

## 2017-01-11 DIAGNOSIS — G8929 Other chronic pain: Secondary | ICD-10-CM

## 2017-01-11 NOTE — Therapy (Signed)
[facility] [address] [place], [place], [address] Phone: [phone]   Fax:  [phone]  Physical Therapy Treatment  Patient Details  Name: Jake StalkerRonnie T Mungin MRN: [id number] Date of Birth: Dec 06, 1955 Referring Provider: Joette CatchingLeonard Nyland, MD  Encounter Date: 01/11/2017      PT End of Session - 01/11/17 1737    Visit Number 3   Number of Visits 13   Date for PT Re-Evaluation 01/25/17   Authorization Type [employer]   Authorization Time Period 01/04/17 to 02/15/17   PT Start Time 1648   PT Stop Time 1728   PT Time Calculation (min) 40 min   Activity Tolerance Patient tolerated treatment well   Behavior During Therapy [facility] for tasks assessed/performed      Past Medical History:  Diagnosis Date  . Arthritis   . GERD (gastroesophageal reflux disease)   . History of hiatal hernia   . HTN (hypertension)   . Wears glasses     Past Surgical History:  Procedure Laterality Date  . ANKLE FRACTURE SURGERY     left / 2005   . KNEE SURGERY     scoped 1994/left   . TOTAL HIP ARTHROPLASTY Left 07/07/2015   Procedure: LEFT TOTAL HIP ARTHROPLASTY ANTERIOR APPROACH;  Surgeon: Ollen GrossFrank Aluisio, MD;  Location: [facility];  Service: Orthopedics;  Laterality: Left;  Marland Kitchen. VASECTOMY      There were no vitals filed for this visit.      Subjective Assessment - 01/11/17 1649    Subjective Paitent arrives stating it has been rough, he feels like he is more sore and like he can move less. His HEP is still hurting him. His pain is to the point he can barely stand it now.    Patient Stated Goals get ROM back   Currently in Pain? Yes   Pain Score 2    Pain Location Shoulder   Pain Orientation Left                         [facility] Adult PT Treatment/Exercise - 01/11/17 0001      Shoulder Exercises: Seated   Retraction Both;20 reps   Theraband Level (Shoulder Retraction) --  no band, 3 second holds    Flexion PROM;10 reps;Left   Flexion Limitations on table, 10 second holds    Abduction Left;PROM;10 reps;Other (comment)  10 second holds    ABduction Limitations 10 second holds    Other Seated Exercises shoulder rolls 1x20 "up back down"      Shoulder Exercises: Prone   Other Prone Exercises blackburn 6 1x10 each L      Shoulder Exercises: Standing   Other Standing Exercises standing weighted pendulums with 4# x10-20 all directinos and circles      Manual Therapy   Manual Therapy Joint mobilization;Soft tissue mobilization   Manual therapy comments performed separately from all other skilled serrvices    Joint Mobilization anterior shoulder mobilization grade III    Soft tissue mobilization STM anterior L shoulder primarily                 PT Education - 01/11/17 1737    Education provided Yes   Education Details encouraged use of moist heat on shoulder    Person(s) Educated Patient   Methods Explanation   Comprehension Verbalized understanding          PT Short Term Goals - 01/04/17 1608      PT  SHORT TERM GOAL #1   Title Pt will be independent with HEP and perform consistently in order to maximize return to PLOF and decrease risk for reinjury.   Time 3   Period Weeks   Status New     PT SHORT TERM GOAL #2   Title Pt will have improved L shoulder IR AROM to at least T10 to maximize his ability to dress and bathe.    Time 3   Period Weeks   Status New           PT Long Term Goals - 01/04/17 1610      PT LONG TERM GOAL #1   Title Pt will have improved L shoulder flexion and abd AROM to at least 150 deg with minimal to no pain in order to maximize his function at home and at work.    Time 6   Period Weeks   Status New     PT LONG TERM GOAL #2   Title Pt will have an increased L ER PROM to 70 deg in order to maximize OH movements and to decrease pain.    Time 6   Period Weeks   Status New     PT LONG TERM GOAL #3   Title Pt will be able to attain and maintain proper  sitting posture 75% of the time to decrease pain and maximize overall function as well as decrease risk for reinjury.   Time 6   Period Weeks   Status New               Plan - 01/11/17 1738    Clinical Impression Statement Patient arrives continuing to complain of L shoulder pain and soreness; began session with STM mostly to anterior L shoulder, noting significant knotting in the area of anterior shoulder flexor muscle groups, also performed grade III anterior shoulder and scapula mobilization this session as well. Otherwise focused on general shoulder mobility and scapular control  today. Patient reports reduced pain and demonstrates increased shoulder mobility at end of session.    Rehab Potential Good   PT Frequency 2x / week   PT Duration 6 weeks   PT Treatment/Interventions ADLs/Self Care Home Management;Cryotherapy;Electrical Stimulation;Moist Heat;Therapeutic activities;Therapeutic exercise;Neuromuscular re-education;Patient/family education;Manual techniques;Passive range of motion;Dry needling;Taping   PT Next Visit Plan continue L shoulder anterior and inferior mobs; ER ROM; PROM and AAROM. Shoulder stretches. Manual anterior shoulder. Scapular control.    PT Home Exercise Plan eval: ER with wand, IR stretch with towel; 7/16: 3D thoracic excursions    Consulted and Agree with Plan of Care Patient      Patient will benefit from skilled therapeutic intervention in order to improve the following deficits and impairments:  Decreased range of motion, Hypomobility, Increased muscle spasms, Impaired flexibility, Impaired UE functional use, Postural dysfunction, Pain, Obesity  Visit Diagnosis: Chronic left shoulder pain  Abnormal posture  Other symptoms and signs involving the musculoskeletal system     Problem List Patient Active Problem List   Diagnosis Date Noted  . OA (osteoarthritis) of hip 07/07/2015  . Plica of knee 05/29/2011  . Torn meniscus 05/23/2011  . KNEE,  ARTHRITIS, DEGEN./OSTEO 04/25/2010  . SCIATICA 04/25/2010    Nedra Hai PT, DPT [phone]  [facility] [address] [place], [place], [address] Phone: [phone]   Fax:  [phone]  Name: SIMON LLAMAS MRN: [id number] Date of Birth: 21-Aug-1955

## 2017-01-16 ENCOUNTER — Ambulatory Visit (HOSPITAL_COMMUNITY): Payer: 59

## 2017-01-16 DIAGNOSIS — R293 Abnormal posture: Secondary | ICD-10-CM

## 2017-01-16 DIAGNOSIS — G8929 Other chronic pain: Secondary | ICD-10-CM

## 2017-01-16 DIAGNOSIS — R29898 Other symptoms and signs involving the musculoskeletal system: Secondary | ICD-10-CM

## 2017-01-16 DIAGNOSIS — M25512 Pain in left shoulder: Secondary | ICD-10-CM | POA: Diagnosis not present

## 2017-01-16 NOTE — Therapy (Signed)
[facility] [address] [place], [place], [address] Phone: [phone]   Fax:  [phone]  Physical Therapy Treatment  Patient Details  Name: Jake Clark MRN: [id number] Date of Birth: 12-May-1956 Referring Provider: Joette CatchingLeonard Nyland, MD  Encounter Date: 01/16/2017    Past Medical History:  Diagnosis Date  . Arthritis   . GERD (gastroesophageal reflux disease)   . History of hiatal hernia   . HTN (hypertension)   . Wears glasses     Past Surgical History:  Procedure Laterality Date  . ANKLE FRACTURE SURGERY     left / 2005   . KNEE SURGERY     scoped 1994/left   . TOTAL HIP ARTHROPLASTY Left 07/07/2015   Procedure: LEFT TOTAL HIP ARTHROPLASTY ANTERIOR APPROACH;  Surgeon: Ollen GrossFrank Aluisio, MD;  Location: [facility];  Service: Orthopedics;  Laterality: Left;  Marland Kitchen. VASECTOMY      There were no vitals filed for this visit.      Subjective Assessment - 01/16/17 1605    Subjective Pt stated he has increased pain with HEP exercises especially with towel IR exercise.  Reports intermittent lt shoulder pain dependind upon movements.  Reports positive results followingmanual.   Patient Stated Goals get ROM back   Currently in Pain? Yes   Pain Score 1    Pain Location Shoulder   Pain Orientation Left;Anterior   Pain Descriptors / Indicators Sore   Pain Type Chronic pain   Pain Onset More than a month ago   Pain Frequency Constant   Aggravating Factors  IR stretch, getting out of car, ABD   Pain Relieving Factors not a lot   Effect of Pain on Daily Activities moderate impact             [facility] Adult PT Treatment/Exercise - 01/16/17 0001      Shoulder Exercises: Supine   External Rotation 10 reps   Internal Rotation 10 reps   Flexion Left;10 reps;AAROM   ABduction Left;10 reps     Shoulder Exercises: Standing   External Rotation 10 reps   Theraband Level (Shoulder External Rotation) --  doorway st   Internal Rotation 10 reps    Internal Rotation Limitations doorway st   Extension Strengthening;Both;15 reps   Theraband Level (Shoulder Extension) Level 2 (Red)   Retraction Both;15 reps   Theraband Level (Shoulder Retraction) Level 2 (Red)   Other Standing Exercises reviewed form with IR stretch      Manual Therapy   Manual Therapy Joint mobilization;Soft tissue mobilization   Manual therapy comments performed separately from all other skilled serrvices    Joint Mobilization anterior shoulder mobilization grade III    Soft tissue mobilization STM anterior L shoulder primarily                   PT Short Term Goals - 01/04/17 1608      PT SHORT TERM GOAL #1   Title Pt will be independent with HEP and perform consistently in order to maximize return to PLOF and decrease risk for reinjury.   Time 3   Period Weeks   Status New     PT SHORT TERM GOAL #2   Title Pt will have improved L shoulder IR AROM to at least T10 to maximize his ability to dress and bathe.    Time 3   Period Weeks   Status New           PT Long Term Goals - 01/04/17  1610      PT LONG TERM GOAL #1   Title Pt will have improved L shoulder flexion and abd AROM to at least 150 deg with minimal to no pain in order to maximize his function at home and at work.    Time 6   Period Weeks   Status New     PT LONG TERM GOAL #2   Title Pt will have an increased L ER PROM to 70 deg in order to maximize OH movements and to decrease pain.    Time 6   Period Weeks   Status New     PT LONG TERM GOAL #3   Title Pt will be able to attain and maintain proper sitting posture 75% of the time to decrease pain and maximize overall function as well as decrease risk for reinjury.   Time 6   Period Weeks   Status New               Plan - 01/16/17 1724    Clinical Impression Statement Pt stated he continues to have increased pain/soreness following HEP.  Reviewed form and gave recommendations for proper form with IR stretch with  reports of decreased pain and improved stretch.  Session focus on improving shoulder mobility and manual technqiues to reduce knotting in anterior shoulder region.  Vast improvements wiht ROM at EOS and reports of decreased pain.     Rehab Potential Good   PT Frequency 2x / week   PT Duration 6 weeks   PT Treatment/Interventions ADLs/Self Care Home Management;Cryotherapy;Electrical Stimulation;Moist Heat;Therapeutic activities;Therapeutic exercise;Neuromuscular re-education;Patient/family education;Manual techniques;Passive range of motion;Dry needling;Taping   PT Next Visit Plan continue L shoulder anterior and inferior mobs; ER ROM; PROM and AAROM. Shoulder stretches. Manual anterior shoulder. Scapular control.       Patient will benefit from skilled therapeutic intervention in order to improve the following deficits and impairments:  Decreased range of motion, Hypomobility, Increased muscle spasms, Impaired flexibility, Impaired UE functional use, Postural dysfunction, Pain, Obesity  Visit Diagnosis: Chronic left shoulder pain  Abnormal posture  Other symptoms and signs involving the musculoskeletal system     Problem List Patient Active Problem List   Diagnosis Date Noted  . OA (osteoarthritis) of hip 07/07/2015  . Plica of knee 05/29/2011  . Torn meniscus 05/23/2011  . KNEE, ARTHRITIS, DEGEN./OSTEO 04/25/2010  . SCIATICA 04/25/2010   Becky Sax, LPTA; CBIS [phone]  Juel Burrow 01/16/2017, 5:40 PM  [facility] [address] [place], [place], [address] Phone: [phone]   Fax:  [phone]  Name: Jake Clark MRN: [id number] Date of Birth: 1955/10/12

## 2017-01-18 ENCOUNTER — Ambulatory Visit (HOSPITAL_COMMUNITY): Payer: 59

## 2017-01-18 ENCOUNTER — Telehealth (HOSPITAL_COMMUNITY): Payer: Self-pay

## 2017-01-18 NOTE — Telephone Encounter (Signed)
Pt will not be able to leave work today for this apptment.

## 2017-01-23 ENCOUNTER — Telehealth (HOSPITAL_COMMUNITY): Payer: Self-pay | Admitting: Family Medicine

## 2017-01-23 ENCOUNTER — Ambulatory Visit (HOSPITAL_COMMUNITY): Payer: 59 | Admitting: Physical Therapy

## 2017-01-23 NOTE — Telephone Encounter (Signed)
01/23/17  pt left a message that he would need to cx because of his work schedule... it has changed.... he hopes that he can get back later on

## 2017-01-25 ENCOUNTER — Ambulatory Visit (HOSPITAL_COMMUNITY): Payer: 59 | Admitting: Physical Therapy

## 2017-01-30 ENCOUNTER — Ambulatory Visit (HOSPITAL_COMMUNITY): Payer: 59 | Admitting: Physical Therapy

## 2017-02-01 ENCOUNTER — Encounter (HOSPITAL_COMMUNITY): Payer: 59

## 2017-02-06 ENCOUNTER — Encounter (HOSPITAL_COMMUNITY): Payer: 59

## 2017-02-08 ENCOUNTER — Encounter (HOSPITAL_COMMUNITY): Payer: 59

## 2017-05-07 ENCOUNTER — Encounter (HOSPITAL_COMMUNITY): Payer: Self-pay

## 2017-05-07 NOTE — Therapy (Signed)
[place], [place], [address] Phone: [phone]   Fax:  [phone]  Patient Details  Name: Jake Clark MRN: [id number] Date of Birth: 08/19/1955 Referring Provider:  No ref. provider found  Encounter Date: 05/07/2017  PHYSICAL THERAPY DISCHARGE SUMMARY  Visits from Start of Care: 3  Current functional level related to goals / functional outcomes: See last treatment note   Remaining deficits: See last treatment note   Education / Equipment: NA  Plan: Patient agrees to discharge.  Patient goals were not met. Patient is being discharged due to not returning since the last visit.  ?????      Debara Pickett, PT, DPT Physical Therapist with [facility]  05/07/2017 8:42 AM     [place], [place], [address] Phone: [phone]   Fax:  [phone]

## 2017-07-07 IMAGING — DX DG PORTABLE PELVIS
1 series · 1 of 1 positions shown · non-contrast
Comparison: None.

CLINICAL DATA: Status post left hip replacement

EXAM:
PORTABLE PELVIS 1-2 VIEWS

[pelvis ap]
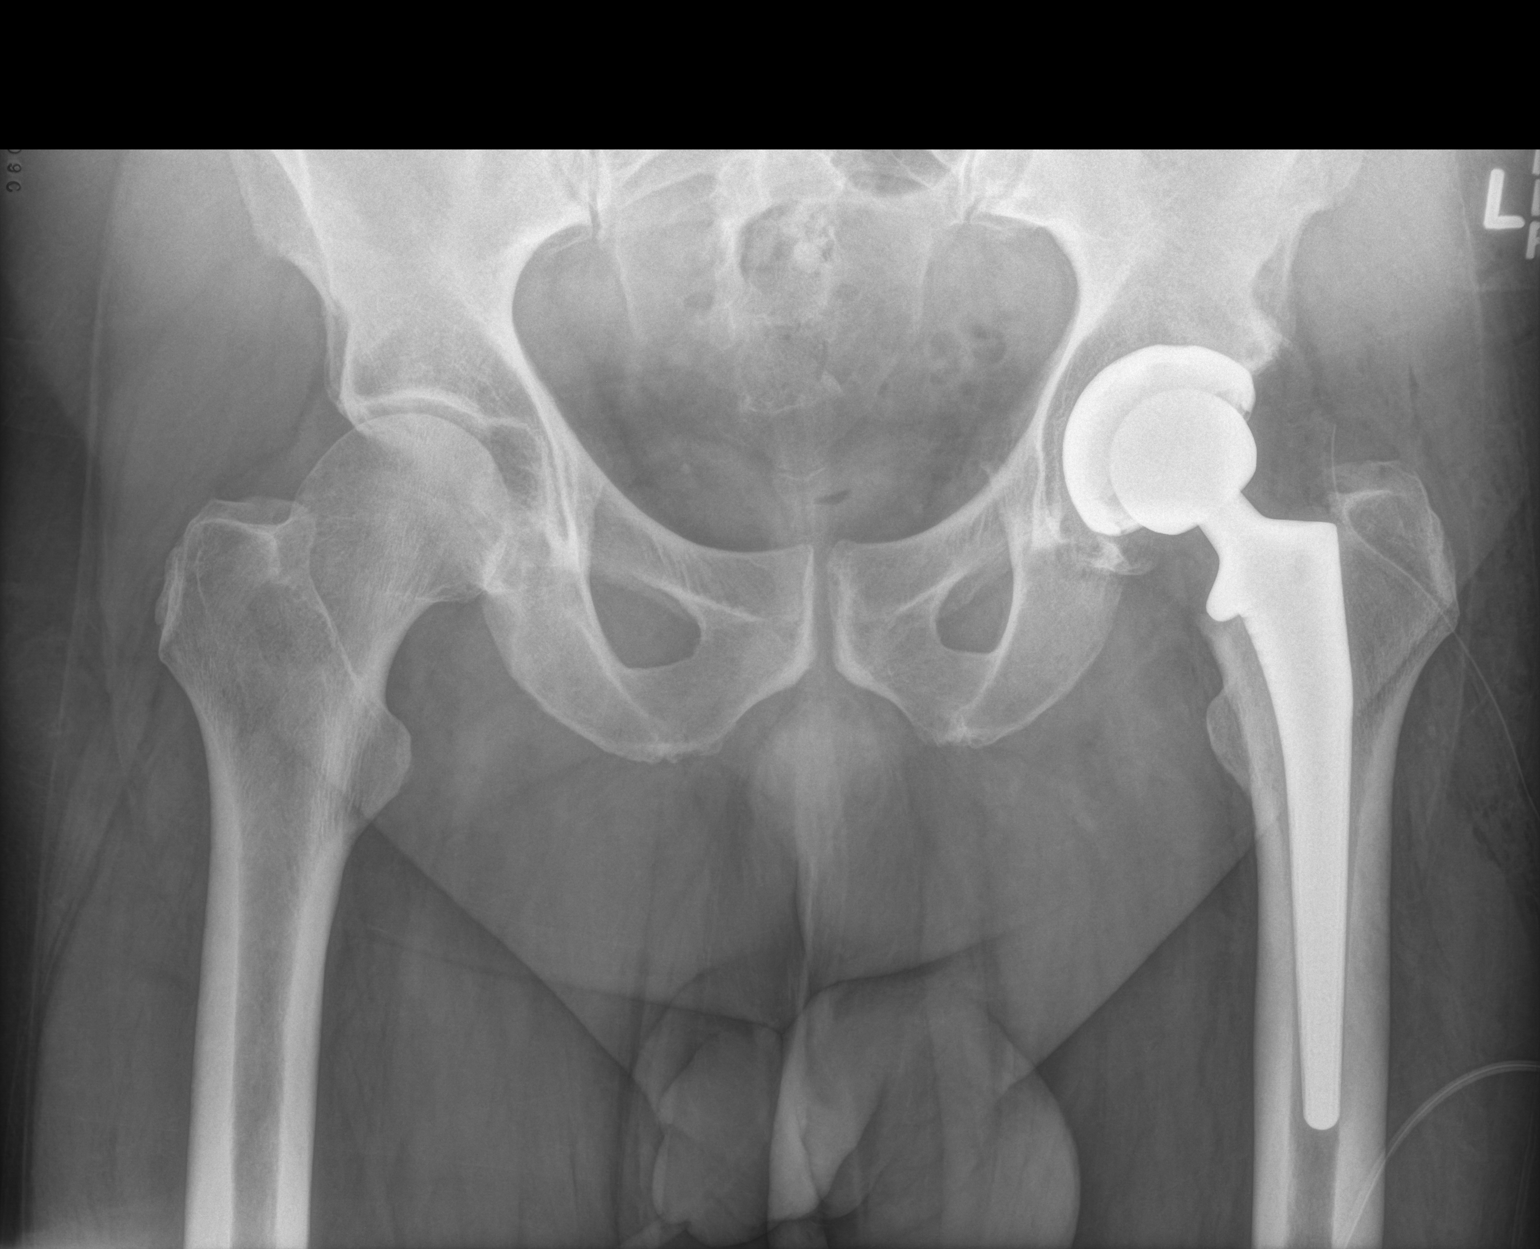

[1 of 1 positions shown; findings below may reference images not displayed]

FINDINGS: Left hip prosthesis is noted. Surgical drain is noted in place. No
acute fracture or dislocation is noted. No gross soft tissue
abnormality is seen.
IMPRESSION: Status post left hip replacement.

These results were called by telephone at the time of interpretation
on 07/07/2015 at [DATE] to Anthonio, the patients nurse, who verbally
acknowledged these results.

## 2019-03-06 ENCOUNTER — Encounter (HOSPITAL_COMMUNITY): Payer: Self-pay | Admitting: *Deleted

## 2019-03-06 ENCOUNTER — Other Ambulatory Visit: Payer: Self-pay

## 2019-03-06 ENCOUNTER — Emergency Department (HOSPITAL_COMMUNITY)
Admission: EM | Admit: 2019-03-06 | Discharge: 2019-03-06 | Disposition: A | Discharge status: Home / Self Care | Payer: 59 | Attending: Emergency Medicine | Admitting: Emergency Medicine

## 2019-03-06 DIAGNOSIS — Z5321 Procedure and treatment not carried out due to patient leaving prior to being seen by health care provider: Secondary | ICD-10-CM | POA: Insufficient documentation

## 2019-03-06 DIAGNOSIS — R109 Unspecified abdominal pain: Secondary | ICD-10-CM | POA: Diagnosis not present

## 2019-03-06 NOTE — ED Triage Notes (Signed)
Pt with abd pain since Sat. With nausea, diarrhea on Monday and was LBM on Monday.  Pt denies emesis.

## 2019-03-07 ENCOUNTER — Other Ambulatory Visit: Payer: Self-pay | Admitting: Family Medicine

## 2019-12-10 ENCOUNTER — Ambulatory Visit (INDEPENDENT_AMBULATORY_CARE_PROVIDER_SITE_OTHER): Payer: 59 | Admitting: Ophthalmology

## 2019-12-10 ENCOUNTER — Encounter (INDEPENDENT_AMBULATORY_CARE_PROVIDER_SITE_OTHER): Payer: Self-pay | Admitting: Ophthalmology

## 2019-12-10 ENCOUNTER — Other Ambulatory Visit: Payer: Self-pay

## 2019-12-10 DIAGNOSIS — H43822 Vitreomacular adhesion, left eye: Secondary | ICD-10-CM | POA: Diagnosis not present

## 2019-12-10 DIAGNOSIS — H348312 Tributary (branch) retinal vein occlusion, right eye, stable: Secondary | ICD-10-CM | POA: Diagnosis not present

## 2019-12-10 DIAGNOSIS — H35371 Puckering of macula, right eye: Secondary | ICD-10-CM

## 2019-12-10 DIAGNOSIS — H35372 Puckering of macula, left eye: Secondary | ICD-10-CM

## 2019-12-10 DIAGNOSIS — H43391 Other vitreous opacities, right eye: Secondary | ICD-10-CM | POA: Diagnosis not present

## 2019-12-10 DIAGNOSIS — H43811 Vitreous degeneration, right eye: Secondary | ICD-10-CM

## 2019-12-10 DIAGNOSIS — H33311 Horseshoe tear of retina without detachment, right eye: Secondary | ICD-10-CM | POA: Insufficient documentation

## 2019-12-10 DIAGNOSIS — Z8669 Personal history of other diseases of the nervous system and sense organs: Secondary | ICD-10-CM

## 2019-12-10 HISTORY — DX: Other vitreous opacities, right eye: H43.391

## 2019-12-10 HISTORY — DX: Vitreomacular adhesion, left eye: H43.822

## 2019-12-10 HISTORY — DX: Vitreous degeneration, right eye: H43.811

## 2019-12-10 NOTE — Progress Notes (Signed)
12/10/2019     CHIEF COMPLAINT Patient presents for Retina Follow Up   HISTORY OF PRESENT ILLNESS: Jake Clark is a 64 y.o. male who presents to the clinic today for:   HPI    Retina Follow Up    Patient presents with  Other.  In right eye.  This started 5 months ago.  Severity is mild.  Duration of 5 months.  Since onset it is stable.          Comments    F/U OU prior to YAG with Dr. Lucita Ferrara  Pt denies noticeable changes to [facility] OU since last visit. Pt denies ocular pain, flashes of light, or floaters OU. Pt is on "grey cap" drop OD BID from Dr. Lucita Ferrara.        Last edited by Rockie Neighbours, [place] on 12/10/2019  1:36 PM. (History)      Referring physician: Dione Housekeeper, MD [place],  [place] [id number]  HISTORICAL INFORMATION:   Selected notes from the [facility]: No current outpatient medications on file. (Ophthalmic Drugs)   No current facility-administered medications for this visit. (Ophthalmic Drugs)   Current Outpatient Medications (Other)  Medication Sig  . amLODipine (NORVASC) 5 MG tablet Take 5 mg by mouth daily.  Marland Kitchen atorvastatin (LIPITOR) 20 MG tablet Take 5 mg by mouth daily.   . clobetasol cream (TEMOVATE) 1.27 % Apply 1 application topically 2 (two) times daily as needed. For rash. (not to face, groin, or underarms)  . docusate sodium (COLACE) 100 MG capsule Take 100 mg by mouth 2 (two) times daily.  . hydrochlorothiazide (MICROZIDE) 12.5 MG capsule Take 12.5 mg by mouth daily.  . meloxicam (MOBIC) 7.5 MG tablet Take 7.5 mg by mouth 2 (two) times daily as needed for pain.  . methocarbamol (ROBAXIN) 500 MG tablet Take 1 tablet (500 mg total) by mouth every 6 (six) hours as needed for muscle spasms. (Patient not taking: Reported on 01/04/2017)  . oxyCODONE (OXY IR/ROXICODONE) 5 MG immediate release tablet Take 1-2 tablets (5-10 mg total) by mouth every 3 (three) hours as needed for moderate pain  or severe pain. (Patient not taking: Reported on 01/04/2017)  . rivaroxaban (XARELTO) 10 MG TABS tablet Take 1 tablet (10 mg total) by mouth daily with breakfast. Take Xarelto for two and a half more weeks, then discontinue Xarelto. Once the patient has completed the Xarelto, they may resume the 81 mg Aspirin. (Patient not taking: Reported on 01/04/2017)  . traMADol (ULTRAM) 50 MG tablet Take 1-2 tablets (50-100 mg total) by mouth every 6 (six) hours as needed (mild pain). (Patient not taking: Reported on 01/04/2017)  . valsartan (DIOVAN) 160 MG tablet Take 160 mg by mouth daily.   No current facility-administered medications for this visit. (Other)      REVIEW OF SYSTEMS:    ALLERGIES Allergies  Allergen Reactions  . Latex Other (See Comments)    Skin sensitivity   . Diclofenac Sodium Itching and Rash    PAST MEDICAL HISTORY Past Medical History:  Diagnosis Date  . Arthritis   . GERD (gastroesophageal reflux disease)   . History of hiatal hernia   . HTN (hypertension)   . Wears glasses    Past Surgical History:  Procedure Laterality Date  . ANKLE FRACTURE SURGERY     left / 2005   . KNEE SURGERY     scoped 1994/left   . TOTAL HIP ARTHROPLASTY  Left 07/07/2015   Procedure: LEFT TOTAL HIP ARTHROPLASTY ANTERIOR APPROACH;  Surgeon: Ollen Gross, MD;  Location: [facility];  Service: Orthopedics;  Laterality: Left;  Marland Kitchen VASECTOMY      FAMILY HISTORY Family History  Problem Relation Age of Onset  . Heart disease Other     SOCIAL HISTORY Social History   Tobacco Use  . Smoking status: Never Smoker  . Smokeless tobacco: Never Used  Substance Use Topics  . Alcohol use: No  . Drug use: No         OPHTHALMIC EXAM:  Base Eye Exam    Visual Acuity (ETDRS)      Right Left   Dist cc 20/20 -1 20/20   Correction: Contacts       Tonometry (Tonopen, 1:43 PM)      Right Left   Pressure 08 07       Pupils      Pupils Dark Light Shape React APD   Right PERRL 4 3 Round  Brisk None   Left PERRL 4 3 Round Brisk None       Visual Fields (Counting fingers)      Left Right    Full Full       Extraocular Movement      Right Left    Full Full       Neuro/Psych    Oriented x3: Yes   Mood/Affect: Normal       Dilation    Both eyes: 1.0% Mydriacyl, 2.5% Phenylephrine @ 1:43 PM        Slit Lamp and Fundus Exam    External Exam      Right Left   External Normal Normal       Slit Lamp Exam      Right Left   Lids/Lashes Normal Normal   Conjunctiva/Sclera White and quiet White and quiet   Cornea Clear Clear   Anterior Chamber Deep and quiet Deep and quiet   Iris Round and reactive Round and reactive   Lens Centered posterior chamber intraocular lens Centered posterior chamber intraocular lens   Anterior Vitreous Anterior vitreous veils Normal       Fundus Exam      Right Left   Posterior Vitreous Posterior vitreous debris mostly in the visual axis, Posterior vitreous detachment Normal   Disc Normal Normal   C/D Ratio 0.5 0.5   Macula Normal Normal   Vessels Normal Normal   Periphery Good retinopexy superiorly, scleral indentation visible, from buckle Normal, no holes or tears          IMAGING AND PROCEDURES  Imaging and Procedures for 12/10/19  OCT, Retina - OU - Both Eyes       Right Eye Quality was good. Scan locations included subfoveal. Central Foveal Thickness: 345. Progression has been stable. Findings include epiretinal membrane, abnormal foveal contour.   Left Eye Quality was good. Scan locations included subfoveal. Central Foveal Thickness: 321. Findings include vitreous traction, epiretinal membrane, abnormal foveal contour.        Color Fundus Photography Optos - OU - Both Eyes       Right Eye Progression has been stable. Disc findings include normal observations. Macula : normal observations. Vessels : normal observations.   Notes OD, good retinopexy scleral buckle present, retina attached Left eye, clear  media, normal vessels macular nerve in periphery.                  ASSESSMENT/PLAN:  Left epiretinal membrane OS,  currently minor with no topographic distortion to the foveal region, will observe  Vitreomacular adhesion of left eye Not visually significant at this time      ICD-10-CM   1. Vitreous floaters of right eye  H43.391   2. Left epiretinal membrane  H35.372   3. Right epiretinal membrane  H35.371   4. Vitreomacular adhesion of left eye  H43.822   5. Posterior vitreous detachment of right eye  H43.811   6. 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 History of retinal detachment  Z86.69     1. No current retinal holes or tears in the right eye. Therefore the standard risk for laser vitreal lysis exists.  2. Patient may follow-up here at any time if symptoms do not improve  3.  Ophthalmic Meds Ordered this visit:  No orders of the defined types were placed in this encounter.      Return ,, Option to return here for follow-up visit right eye as needed, for Follow-up with Tyrone Schimke as scheduled.  There are no Patient Instructions on file for this visit.   Explained the diagnoses, plan, and follow up with the patient and they expressed understanding.  Patient expressed understanding of the importance of proper follow up care.   Alford Highland Leyna Vanderkolk M.D. Diseases & Surgery of the Retina and Vitreous [facility] 12/10/19     Abbreviations: M myopia (nearsighted); A astigmatism; H hyperopia (farsighted); P presbyopia; Mrx spectacle prescription;  CTL contact lenses; OD right eye; OS left eye; OU both eyes  XT exotropia; ET esotropia; PEK punctate epithelial keratitis; PEE punctate epithelial erosions; DES dry eye syndrome; MGD meibomian gland dysfunction; ATs artificial tears; PFAT's preservative free artificial tears; NSC nuclear sclerotic  cataract; PSC posterior subcapsular cataract; ERM epi-retinal membrane; PVD posterior vitreous detachment; RD retinal detachment; DM diabetes mellitus; DR diabetic retinopathy; NPDR non-proliferative diabetic retinopathy; PDR proliferative diabetic retinopathy; CSME clinically significant macular edema; DME diabetic macular edema; dbh dot blot hemorrhages; CWS cotton wool spot; POAG primary open angle glaucoma; C/D cup-to-disc ratio; HVF humphrey visual field; GVF goldmann visual field; OCT optical coherence tomography; IOP intraocular pressure; BRVO Branch retinal vein occlusion; CRVO central retinal vein occlusion; CRAO central retinal artery occlusion; BRAO branch retinal artery occlusion; RT retinal tear; SB scleral buckle; PPV pars plana vitrectomy; VH Vitreous hemorrhage; PRP panretinal laser photocoagulation; IVK intravitreal kenalog; VMT vitreomacular traction; MH Macular hole;  NVD neovascularization of the disc; NVE neovascularization elsewhere; AREDS age related eye disease study; ARMD age related macular degeneration; POAG primary open angle glaucoma; EBMD epithelial/anterior basement membrane dystrophy; ACIOL anterior chamber intraocular lens; IOL intraocular lens; PCIOL posterior chamber intraocular lens; Phaco/IOL phacoemulsification with intraocular lens placement; PRK photorefractive keratectomy; LASIK laser assisted in situ keratomileusis; HTN hypertension; DM diabetes mellitus; COPD chronic obstructive pulmonary disease

## 2019-12-10 NOTE — Assessment & Plan Note (Signed)
OS, currently minor with no topographic distortion to the foveal region, will observe

## 2019-12-10 NOTE — Assessment & Plan Note (Signed)
·   Not visually significant at this time

## 2020-01-01 ENCOUNTER — Encounter (INDEPENDENT_AMBULATORY_CARE_PROVIDER_SITE_OTHER): Payer: Self-pay | Admitting: Ophthalmology

## 2020-01-01 ENCOUNTER — Ambulatory Visit (INDEPENDENT_AMBULATORY_CARE_PROVIDER_SITE_OTHER): Payer: 59 | Admitting: Ophthalmology

## 2020-01-01 ENCOUNTER — Other Ambulatory Visit: Payer: Self-pay

## 2020-01-01 DIAGNOSIS — H43311 Vitreous membranes and strands, right eye: Secondary | ICD-10-CM

## 2020-01-01 DIAGNOSIS — Z8669 Personal history of other diseases of the nervous system and sense organs: Secondary | ICD-10-CM

## 2020-01-01 HISTORY — DX: Vitreous membranes and strands, right eye: H43.311

## 2020-01-01 NOTE — Assessment & Plan Note (Signed)
Patient continues to have activities of daily living and function visually hampered by a cobweb in 6 months of vision that moves in and out of his line site affecting those issues that include reading, driving.  Plan will be to proceed with 25-gauge vitrectomy, under local MAC anesthesia, [facility], [facility].  Patient understands this is an attempt to improve visual functioning.  We discussed the risks and benefits thereafter

## 2020-01-01 NOTE — Progress Notes (Signed)
01/01/2020     CHIEF COMPLAINT Patient presents for Retina Follow Up   HISTORY OF PRESENT ILLNESS: Jake Clark is a 64 y.o. male who presents to the clinic today for:   HPI    Retina Follow Up    Patient presents with  PVD.  In right eye.  Duration of 3 weeks.  Since onset it is gradually worsening.          Comments    3 week follow up - Floaters Patient states that his floaters OD are worse than they were last visit.  Patient underwent an attempt to clear vitreous floaters with Dr. Lucita Ferrara however,  Pt activities and daily living hampered by smudged VA of moving floater webs.         Last edited by Hurman Horn, MD on 01/01/2020  9:43 AM. (History)      Referring physician: Dione Housekeeper, MD [place],  [place] [address]  HISTORICAL INFORMATION:   Selected notes from the MEDICAL RECORD NUMBER       CURRENT MEDICATIONS: No current outpatient medications on file. (Ophthalmic Drugs)   No current facility-administered medications for this visit. (Ophthalmic Drugs)   Current Outpatient Medications (Other)  Medication Sig  . amLODipine (NORVASC) 5 MG tablet Take 5 mg by mouth daily.  Marland Kitchen atorvastatin (LIPITOR) 20 MG tablet Take 5 mg by mouth daily.   . clobetasol cream (TEMOVATE) 4.43 % Apply 1 application topically 2 (two) times daily as needed. For rash. (not to face, groin, or underarms)  . docusate sodium (COLACE) 100 MG capsule Take 100 mg by mouth 2 (two) times daily.  . hydrochlorothiazide (MICROZIDE) 12.5 MG capsule Take 12.5 mg by mouth daily.  . meloxicam (MOBIC) 7.5 MG tablet Take 7.5 mg by mouth 2 (two) times daily as needed for pain.  . methocarbamol (ROBAXIN) 500 MG tablet Take 1 tablet (500 mg total) by mouth every 6 (six) hours as needed for muscle spasms. (Patient not taking: Reported on 01/04/2017)  . oxyCODONE (OXY IR/ROXICODONE) 5 MG immediate release tablet Take 1-2 tablets (5-10 mg total) by mouth every 3 (three) hours as  needed for moderate pain or severe pain. (Patient not taking: Reported on 01/04/2017)  . rivaroxaban (XARELTO) 10 MG TABS tablet Take 1 tablet (10 mg total) by mouth daily with breakfast. Take Xarelto for two and a half more weeks, then discontinue Xarelto. Once the patient has completed the Xarelto, they may resume the 81 mg Aspirin. (Patient not taking: Reported on 01/04/2017)  . traMADol (ULTRAM) 50 MG tablet Take 1-2 tablets (50-100 mg total) by mouth every 6 (six) hours as needed (mild pain). (Patient not taking: Reported on 01/04/2017)  . valsartan (DIOVAN) 160 MG tablet Take 160 mg by mouth daily.   No current facility-administered medications for this visit. (Other)      REVIEW OF SYSTEMS:    ALLERGIES Allergies  Allergen Reactions  . Latex Other (See Comments)    Skin sensitivity   . Diclofenac Sodium Itching and Rash    PAST MEDICAL HISTORY Past Medical History:  Diagnosis Date  . Arthritis   . GERD (gastroesophageal reflux disease)   . History of hiatal hernia   . HTN (hypertension)   . Wears glasses    Past Surgical History:  Procedure Laterality Date  . ANKLE FRACTURE SURGERY     left / 2005   . KNEE SURGERY     scoped 1994/left   . TOTAL HIP ARTHROPLASTY Left  07/07/2015   Procedure: LEFT TOTAL HIP ARTHROPLASTY ANTERIOR APPROACH;  Surgeon: Gaynelle Arabian, MD;  Location: [facility];  Service: Orthopedics;  Laterality: Left;  Marland Kitchen VASECTOMY      FAMILY HISTORY Family History  Problem Relation Age of Onset  . Heart disease Other     SOCIAL HISTORY Social History   Tobacco Use  . Smoking status: Never Smoker  . Smokeless tobacco: Never Used  Substance Use Topics  . Alcohol use: No  . Drug use: No         OPHTHALMIC EXAM:  Base Eye Exam    Visual Acuity (Snellen - Linear)      Right Left   Dist cc 20/25 20/20   Correction: Contacts       Tonometry (Tonopen, 8:50 AM)      Right Left   Pressure 17 16       Pupils      Pupils Dark Light Shape  React APD   Right PERRL 4 3 Round Brisk None   Left PERRL 6 5 Round Slow None       Visual Fields (Counting fingers)      Left Right    Full Full       Extraocular Movement      Right Left    Full Full       Neuro/Psych    Oriented x3: Yes   Mood/Affect: Normal       Dilation    Both eyes: 1.0% Mydriacyl, 2.5% Phenylephrine @ 8:50 AM        Slit Lamp and Fundus Exam    External Exam      Right Left   External Normal Normal       Slit Lamp Exam      Right Left   Lids/Lashes Normal Normal   Conjunctiva/Sclera White and quiet White and quiet   Cornea Clear Clear   Anterior Chamber Deep and quiet Deep and quiet   Iris Round and reactive Round and reactive   Lens Posterior chamber intraocular lens Posterior chamber intraocular lens   Anterior Vitreous Normal Normal       Fundus Exam      Right Left   Posterior Vitreous Posterior vitreous debris mostly in the visual axis, Posterior vitreous detachment Normal   Disc Normal Normal   C/D Ratio 0.6 0.5   Macula Normal Normal   Vessels Normal Normal   Periphery Good retinopexy superiorly, scleral indentation visible, from buckle Normal, no holes or tears          IMAGING AND PROCEDURES  Imaging and Procedures for 01/01/20  Color Fundus Photography Optos - OU - Both Eyes       Right Eye Progression has been stable. Disc findings include normal observations. Macula : normal observations. Vessels : normal observations.   Left Eye Progression has been stable. Disc findings include normal observations. Macula : normal observations. Vessels : normal observations. Periphery : normal observations.   Notes OD, status post prior retinal detachment repair via scleral buckle.  Good cryopexy, attached, mild vitreous debris noted centrally  OD                 ASSESSMENT/PLAN:  Vitreous membranes and strands, right Patient continues to have activities of daily living and function visually hampered by a cobweb  in 6 months of vision that moves in and out of his line site affecting those issues that include reading, driving.  Plan will be to proceed with 25-gauge  vitrectomy, under local MAC anesthesia, [facility], [facility].  Patient understands this is an attempt to improve visual functioning.  We discussed the risks and benefits thereafter      ICD-10-CM   1. Vitreous membranes and strands, right  H43.311 Color Fundus Photography Optos - OU - Both Eyes  2. History of retinal detachment  Z86.69 Color Fundus Photography Optos - OU - Both Eyes    1.  We will plan surgical intervention of the right eye for vitreous membranes and strands with visual impact and visual functional loss, after patient has failed laser vitreal lysis with Dr. Lucita Ferrara   2.  Risk and benefits reviewed.  3.  Ophthalmic Meds Ordered this visit:  No orders of the defined types were placed in this encounter.      Return for OD, plan posterior vitrectomy, [facility], [facility].  There are no Patient Instructions on file for this visit.   Explained the diagnoses, plan, and follow up with the patient and they expressed understanding.  Patient expressed understanding of the importance of proper follow up care.   Clent Demark Renata Gambino M.D. Diseases & Surgery of the Retina and Vitreous [facility] [place] 01/01/20     Abbreviations: M myopia (nearsighted); A astigmatism; H hyperopia (farsighted); P presbyopia; Mrx spectacle prescription;  CTL contact lenses; OD right eye; OS left eye; OU both eyes  XT exotropia; ET esotropia; PEK punctate epithelial keratitis; PEE punctate epithelial erosions; DES dry eye syndrome; MGD meibomian gland dysfunction; ATs artificial tears; PFAT's preservative free artificial tears; [place] nuclear sclerotic cataract; PSC posterior subcapsular cataract; ERM epi-retinal membrane; PVD posterior vitreous detachment; RD retinal detachment; DM diabetes mellitus; DR diabetic  retinopathy; NPDR non-proliferative diabetic retinopathy; PDR proliferative diabetic retinopathy; CSME clinically significant macular edema; DME diabetic macular edema; dbh dot blot hemorrhages; CWS cotton wool spot; POAG primary open angle glaucoma; C/D cup-to-disc ratio; HVF humphrey visual field; GVF goldmann visual field; OCT optical coherence tomography; IOP intraocular pressure; BRVO Branch retinal vein occlusion; CRVO central retinal vein occlusion; CRAO central retinal artery occlusion; BRAO branch retinal artery occlusion; RT retinal tear; SB scleral buckle; PPV pars plana vitrectomy; VH Vitreous hemorrhage; PRP panretinal laser photocoagulation; IVK intravitreal kenalog; VMT vitreomacular traction; MH Macular hole;  NVD neovascularization of the disc; NVE neovascularization elsewhere; AREDS age related eye disease study; ARMD age related macular degeneration; POAG primary open angle glaucoma; EBMD epithelial/anterior basement membrane dystrophy; ACIOL anterior chamber intraocular lens; IOL intraocular lens; PCIOL posterior chamber intraocular lens; Phaco/IOL phacoemulsification with intraocular lens placement; [place] photorefractive keratectomy; LASIK laser assisted in situ keratomileusis; HTN hypertension; DM diabetes mellitus; COPD chronic obstructive pulmonary disease

## 2020-01-19 ENCOUNTER — Other Ambulatory Visit: Payer: Self-pay

## 2020-01-19 ENCOUNTER — Ambulatory Visit (INDEPENDENT_AMBULATORY_CARE_PROVIDER_SITE_OTHER): Payer: 59 | Admitting: Ophthalmology

## 2020-01-19 ENCOUNTER — Encounter (INDEPENDENT_AMBULATORY_CARE_PROVIDER_SITE_OTHER): Payer: Self-pay | Admitting: Ophthalmology

## 2020-01-19 DIAGNOSIS — H43312 Vitreous membranes and strands, left eye: Secondary | ICD-10-CM

## 2020-01-19 DIAGNOSIS — H43812 Vitreous degeneration, left eye: Secondary | ICD-10-CM

## 2020-01-19 HISTORY — DX: Vitreous membranes and strands, left eye: H43.312

## 2020-01-19 HISTORY — DX: Vitreous degeneration, left eye: H43.812

## 2020-01-19 NOTE — Assessment & Plan Note (Signed)
The nature of posterior vitreous detachment was discussed with the patient as well as its physiology, its age prevalence, and its possible implication regarding retinal breaks and detachment.  An informational brochure was given to the patient.  All the patient's questions were answered.  The patient was asked to return if new or different flashes or floaters develops.   Patient was instructed to contact office immediately if any changes were noticed. I explained to the patient that vitreous inside the eye is similar to jello inside a bowl. As the jello melts it can start to pull away from the bowl, similarly the vitreous throughout our lives can begin to pull away from the retina. That process is called a posterior vitreous detachment. In some cases, the vitreous can tug hard enough on the retina to form a retinal tear. I discussed with the patient the signs and symptoms of a retinal detachment.  Do not rub the eye.  New, no holes or tears

## 2020-01-19 NOTE — Patient Instructions (Signed)
New symptoms develop or worsen left eye to contact us promptly

## 2020-01-19 NOTE — Assessment & Plan Note (Signed)
New troubling floaters central and paracentral OS secondary to recent PVD.  Patient asked whether it be appropriate to have surgery in the left eye at the same time.  I reassured him it is not appropriate to operate on both eyes the same day for this type of condition.  Also reassured the patient that once surgery is completed in the right eye, being right-handed, right eye dominant, his symptoms are very likely to improve

## 2020-01-19 NOTE — Progress Notes (Signed)
01/19/2020     CHIEF COMPLAINT Patient presents for Flashes/floaters   HISTORY OF PRESENT ILLNESS: Jake Clark is a 64 y.o. male who presents to the clinic today for:   HPI    Flashes/floaters    In left eye.  This started 2 weeks ago.  Duration of 2 weeks.  Duration Constant.  Characterized as small and spots.  Since onset it is gradually worsening.  Associated Symptoms Floaters and Flashes.          Comments    Floaters/Flashes OS  Pt c/o constant floaters OS x 2 weeks that have been gradually increasing in number. Pt c/o flashes of light off and on temporally x 2 days, especially noticeable when dark. OD stable.       Last edited by Ileana Roup, COA on 01/19/2020  2:05 PM. (History)      Referring physician: Jacqualine Mau, NP [address] [place],  [place] [address]  HISTORICAL INFORMATION:   Selected notes from the MEDICAL RECORD NUMBER       CURRENT MEDICATIONS: No current outpatient medications on file. (Ophthalmic Drugs)   No current facility-administered medications for this visit. (Ophthalmic Drugs)   Current Outpatient Medications (Other)  Medication Sig  . amLODipine (NORVASC) 5 MG tablet Take 5 mg by mouth daily.  Marland Kitchen atorvastatin (LIPITOR) 20 MG tablet Take 5 mg by mouth daily.   . clobetasol cream (TEMOVATE) 0.05 % Apply 1 application topically 2 (two) times daily as needed. For rash. (not to face, groin, or underarms)  . docusate sodium (COLACE) 100 MG capsule Take 100 mg by mouth 2 (two) times daily.  . hydrochlorothiazide (MICROZIDE) 12.5 MG capsule Take 12.5 mg by mouth daily.  . meloxicam (MOBIC) 7.5 MG tablet Take 7.5 mg by mouth 2 (two) times daily as needed for pain.  . methocarbamol (ROBAXIN) 500 MG tablet Take 1 tablet (500 mg total) by mouth every 6 (six) hours as needed for muscle spasms. (Patient not taking: Reported on 01/04/2017)  . oxyCODONE (OXY IR/ROXICODONE) 5 MG immediate release tablet Take 1-2 tablets (5-10 mg  total) by mouth every 3 (three) hours as needed for moderate pain or severe pain. (Patient not taking: Reported on 01/04/2017)  . rivaroxaban (XARELTO) 10 MG TABS tablet Take 1 tablet (10 mg total) by mouth daily with breakfast. Take Xarelto for two and a half more weeks, then discontinue Xarelto. Once the patient has completed the Xarelto, they may resume the 81 mg Aspirin. (Patient not taking: Reported on 01/04/2017)  . traMADol (ULTRAM) 50 MG tablet Take 1-2 tablets (50-100 mg total) by mouth every 6 (six) hours as needed (mild pain). (Patient not taking: Reported on 01/04/2017)  . valsartan (DIOVAN) 160 MG tablet Take 160 mg by mouth daily.   No current facility-administered medications for this visit. (Other)      REVIEW OF SYSTEMS:    ALLERGIES Allergies  Allergen Reactions  . Latex Other (See Comments)    Skin sensitivity   . Diclofenac Sodium Itching and Rash    PAST MEDICAL HISTORY Past Medical History:  Diagnosis Date  . Arthritis   . GERD (gastroesophageal reflux disease)   . History of hiatal hernia   . HTN (hypertension)   . Wears glasses    Past Surgical History:  Procedure Laterality Date  . ANKLE FRACTURE SURGERY     left / 2005   . KNEE SURGERY     scoped 1994/left   .  TOTAL HIP ARTHROPLASTY Left 07/07/2015   Procedure: LEFT TOTAL HIP ARTHROPLASTY ANTERIOR APPROACH;  Surgeon: Ollen Gross, MD;  Location: [facility];  Service: Orthopedics;  Laterality: Left;  Marland Kitchen VASECTOMY      FAMILY HISTORY Family History  Problem Relation Age of Onset  . Heart disease Other     SOCIAL HISTORY Social History   Tobacco Use  . Smoking status: Never Smoker  . Smokeless tobacco: Never Used  Substance Use Topics  . Alcohol use: No  . Drug use: No         OPHTHALMIC EXAM:  Base Eye Exam    Visual Acuity (ETDRS)      Right Left   Dist cc 20/20 20/30   Dist ph cc  20/25 -2   Correction: Contacts       Tonometry (Tonopen, 2:09 PM)      Right Left   Pressure  NT 13  CL OD       Pupils      Pupils Dark Light Shape React APD   Right PERRL 4 3 Round Brisk None   Left PERRL 5 4 Round Slow None       Visual Fields (Counting fingers)      Left Right    Full Full       Extraocular Movement      Right Left    Full Full       Neuro/Psych    Oriented x3: Yes   Mood/Affect: Normal       Dilation    Left eye: 1.0% Mydriacyl, 2.5% Phenylephrine @ 2:10 PM        Slit Lamp and Fundus Exam    External Exam      Right Left   External Normal Normal       Slit Lamp Exam      Right Left   Lids/Lashes Normal Normal   Conjunctiva/Sclera White and quiet White and quiet   Cornea Clear Clear   Anterior Chamber Deep and quiet Deep and quiet   Iris Round and reactive Round and reactive   Lens Posterior chamber intraocular lens Posterior chamber intraocular lens   Anterior Vitreous Normal Normal       Fundus Exam      Right Left   Posterior Vitreous  Normal   Disc  Normal   C/D Ratio  0.5   Macula  Normal   Vessels  Normal   Periphery  Normal, no holes or tears,           IMAGING AND PROCEDURES  Imaging and Procedures for 01/19/20  Color Fundus Photography Optos - OU - Both Eyes       Left Eye Progression has worsened. Disc findings include normal observations. Macula : normal observations, drusen. Vessels : normal observations.   Notes OS with new posterior vitreous detachment, vitreous debris, no retinal holes or tears.                ASSESSMENT/PLAN:  Vitreous membranes or strands, left New troubling floaters central and paracentral OS secondary to recent PVD.  Patient asked whether it be appropriate to have surgery in the left eye at the same time.  I reassured him it is not appropriate to operate on both eyes the same day for this type of condition.  Also reassured the patient that once surgery is completed in the right eye, being right-handed, right eye dominant, his symptoms are very likely to  improve  Posterior vitreous detachment of  left eye   The nature of posterior vitreous detachment was discussed with the patient as well as its physiology, its age prevalence, and its possible implication regarding retinal breaks and detachment.  An informational brochure was given to the patient.  All the patient's questions were answered.  The patient was asked to return if new or different flashes or floaters develops.   Patient was instructed to contact office immediately if any changes were noticed. I explained to the patient that vitreous inside the eye is similar to jello inside a bowl. As the jello melts it can start to pull away from the bowl, similarly the vitreous throughout our lives can begin to pull away from the retina. That process is called a posterior vitreous detachment. In some cases, the vitreous can tug hard enough on the retina to form a retinal tear. I discussed with the patient the signs and symptoms of a retinal detachment.  Do not rub the eye.  New, no holes or tears      ICD-10-CM   1. Vitreous membranes or strands, left  H43.312 Color Fundus Photography Optos - OU - Both Eyes  2. Posterior vitreous detachment of left eye  H43.812     1.  2.  3.  Ophthalmic Meds Ordered this visit:  No orders of the defined types were placed in this encounter.      Return for As scheduled for surgical intervention to remove vitreous membranes and strands OD.  Patient Instructions  New symptoms develop or worsen left eye to contact us promptly    Explained the diagnoses, plan, and follow up with the patient and they expressed understanding.  Patient expressed understanding of the importance of proper follow up care.   Alford Highland Odaliz Mcqueary M.D. Diseases & Surgery of the Retina and Vitreous [facility] 01/19/20     Abbreviations: M myopia (nearsighted); A astigmatism; H hyperopia (farsighted); P presbyopia; Mrx spectacle prescription;  CTL contact lenses;  OD right eye; OS left eye; OU both eyes  XT exotropia; ET esotropia; PEK punctate epithelial keratitis; PEE punctate epithelial erosions; DES dry eye syndrome; MGD meibomian gland dysfunction; ATs artificial tears; PFAT's preservative free artificial tears; [place] nuclear sclerotic cataract; PSC posterior subcapsular cataract; ERM epi-retinal membrane; PVD posterior vitreous detachment; RD retinal detachment; DM diabetes mellitus; DR diabetic retinopathy; NPDR non-proliferative diabetic retinopathy; PDR proliferative diabetic retinopathy; CSME clinically significant macular edema; DME diabetic macular edema; dbh dot blot hemorrhages; CWS cotton wool spot; POAG primary open angle glaucoma; C/D cup-to-disc ratio; HVF humphrey visual field; GVF goldmann visual field; OCT optical coherence tomography; IOP intraocular pressure; BRVO Branch retinal vein occlusion; CRVO central retinal vein occlusion; CRAO central retinal artery occlusion; BRAO branch retinal artery occlusion; RT retinal tear; SB scleral buckle; PPV pars plana vitrectomy; VH Vitreous hemorrhage; PRP panretinal laser photocoagulation; IVK intravitreal kenalog; VMT vitreomacular traction; MH Macular hole;  NVD neovascularization of the disc; NVE neovascularization elsewhere; AREDS age related eye disease study; ARMD age related macular degeneration; POAG primary open angle glaucoma; EBMD epithelial/anterior basement membrane dystrophy; ACIOL anterior chamber intraocular lens; IOL intraocular lens; PCIOL posterior chamber intraocular lens; Phaco/IOL phacoemulsification with intraocular lens placement; PRK photorefractive keratectomy; LASIK laser assisted in situ keratomileusis; HTN hypertension; DM diabetes mellitus; COPD chronic obstructive pulmonary disease

## 2020-01-21 ENCOUNTER — Other Ambulatory Visit: Payer: Self-pay

## 2020-01-21 ENCOUNTER — Ambulatory Visit (INDEPENDENT_AMBULATORY_CARE_PROVIDER_SITE_OTHER): Payer: 59

## 2020-01-21 ENCOUNTER — Encounter (INDEPENDENT_AMBULATORY_CARE_PROVIDER_SITE_OTHER): Payer: Self-pay

## 2020-01-21 DIAGNOSIS — H43311 Vitreous membranes and strands, right eye: Secondary | ICD-10-CM

## 2020-01-21 MED ORDER — PREDNISOLONE ACETATE 1 % OP SUSP
1.0000 [drp] | Freq: Four times a day (QID) | OPHTHALMIC | 0 refills | Status: DC
Start: 2020-01-21 — End: 2020-12-07

## 2020-01-21 MED ORDER — OFLOXACIN 0.3 % OP SOLN: 1.0000 [drp] | Freq: Four times a day (QID) | OPHTHALMIC | 0 refills | Status: AC

## 2020-01-21 NOTE — Progress Notes (Signed)
01/21/2020     CHIEF COMPLAINT Patient presents for Pre-op Exam   HISTORY OF PRESENT ILLNESS: Jake Clark is a 64 y.o. male who presents to the clinic today for:   HPI    Pre op OD for 01/28/20 sx PPV OD for vit strands   Last edited by Ileana Roup, COA on 01/21/2020  1:27 PM. (History)        HISTORICAL INFORMATION:   Selected notes from the MEDICAL RECORD NUMBER       CURRENT MEDICATIONS: No current outpatient medications on file. (Ophthalmic Drugs)   No current facility-administered medications for this visit. (Ophthalmic Drugs)   Current Outpatient Medications (Other)  Medication Sig   amLODipine (NORVASC) 5 MG tablet Take 5 mg by mouth daily.   atorvastatin (LIPITOR) 20 MG tablet Take 5 mg by mouth daily.    clobetasol cream (TEMOVATE) 0.05 % Apply 1 application topically 2 (two) times daily as needed. For rash. (not to face, groin, or underarms)   docusate sodium (COLACE) 100 MG capsule Take 100 mg by mouth 2 (two) times daily.   hydrochlorothiazide (MICROZIDE) 12.5 MG capsule Take 12.5 mg by mouth daily.   meloxicam (MOBIC) 7.5 MG tablet Take 7.5 mg by mouth 2 (two) times daily as needed for pain.   methocarbamol (ROBAXIN) 500 MG tablet Take 1 tablet (500 mg total) by mouth every 6 (six) hours as needed for muscle spasms. (Patient not taking: Reported on 01/04/2017)   oxyCODONE (OXY IR/ROXICODONE) 5 MG immediate release tablet Take 1-2 tablets (5-10 mg total) by mouth every 3 (three) hours as needed for moderate pain or severe pain. (Patient not taking: Reported on 01/04/2017)   rivaroxaban (XARELTO) 10 MG TABS tablet Take 1 tablet (10 mg total) by mouth daily with breakfast. Take Xarelto for two and a half more weeks, then discontinue Xarelto. Once the patient has completed the Xarelto, they may resume the 81 mg Aspirin. (Patient not taking: Reported on 01/04/2017)   traMADol (ULTRAM) 50 MG tablet Take 1-2 tablets (50-100 mg total) by mouth every 6  (six) hours as needed (mild pain). (Patient not taking: Reported on 01/04/2017)   valsartan (DIOVAN) 160 MG tablet Take 160 mg by mouth daily.   No current facility-administered medications for this visit. (Other)     ALLERGIES Allergies  Allergen Reactions   Latex Other (See Comments)    Skin sensitivity    Diclofenac Sodium Itching and Rash    PAST MEDICAL HISTORY Past Medical History:  Diagnosis Date   Arthritis    GERD (gastroesophageal reflux disease)    History of hiatal hernia    HTN (hypertension)    Wears glasses    Past Surgical History:  Procedure Laterality Date   ANKLE FRACTURE SURGERY     left / 2005    KNEE SURGERY     scoped 1994/left    TOTAL HIP ARTHROPLASTY Left 07/07/2015   Procedure: LEFT TOTAL HIP ARTHROPLASTY ANTERIOR APPROACH;  Surgeon: Ollen Gross, MD;  Location: [facility];  Service: Orthopedics;  Laterality: Left;   VASECTOMY      FAMILY HISTORY Family History  Problem Relation Age of Onset   Heart disease Other     SOCIAL HISTORY Social History   Tobacco Use   Smoking status: Never Smoker   Smokeless tobacco: Never Used  Substance Use Topics   Alcohol use: No   Drug use: No         OPHTHALMIC EXAM: Base Eye Exam  Visual Acuity (ETDRS)      Right Left   Dist cc 20/20    Correction: Contacts       Tonometry    Tonopen, 1:27 PM          IMAGING AND PROCEDURES  Imaging and Procedures for @TODAY @           ASSESSMENT/PLAN:  No diagnosis found.  Ophthalmic Meds Ordered this visit:  No orders of the defined types were placed in this encounter.       Pre-op completed. Operative consent obtained with pre-op eye drops reviewed with and sent via [facility] as needed. Post op instructions reviewed with patient and per patient all questions answered.  [facility], [facility]A

## 2020-01-28 ENCOUNTER — Encounter (INDEPENDENT_AMBULATORY_CARE_PROVIDER_SITE_OTHER): Payer: 59 | Admitting: Ophthalmology

## 2020-01-28 DIAGNOSIS — H33021 Retinal detachment with multiple breaks, right eye: Secondary | ICD-10-CM

## 2020-01-28 DIAGNOSIS — H43311 Vitreous membranes and strands, right eye: Secondary | ICD-10-CM | POA: Diagnosis not present

## 2020-01-28 HISTORY — PX: EYE SURGERY: SHX253

## 2020-01-29 ENCOUNTER — Other Ambulatory Visit: Payer: Self-pay

## 2020-01-29 ENCOUNTER — Encounter (INDEPENDENT_AMBULATORY_CARE_PROVIDER_SITE_OTHER): Payer: Self-pay | Admitting: Ophthalmology

## 2020-01-29 ENCOUNTER — Ambulatory Visit (INDEPENDENT_AMBULATORY_CARE_PROVIDER_SITE_OTHER): Payer: 59 | Admitting: Ophthalmology

## 2020-01-29 DIAGNOSIS — Z09 Encounter for follow-up examination after completed treatment for conditions other than malignant neoplasm: Secondary | ICD-10-CM

## 2020-01-29 DIAGNOSIS — H43311 Vitreous membranes and strands, right eye: Secondary | ICD-10-CM

## 2020-01-29 DIAGNOSIS — H43312 Vitreous membranes and strands, left eye: Secondary | ICD-10-CM

## 2020-01-29 NOTE — Assessment & Plan Note (Signed)
Dilate OD S next week and evaluate

## 2020-01-29 NOTE — Progress Notes (Signed)
01/29/2020     CHIEF COMPLAINT Patient presents for Post-op Follow-up   HISTORY OF PRESENT ILLNESS: Jake Clark is a 63 y.o. male who presents to the clinic today for:   HPI    Post-op Follow-up    In right eye.  Discomfort includes pain.  I, the attending physician,  performed the HPI with the patient and updated documentation appropriately.          Comments    1 Day s\p Vitrectomy OD,, "floaters gone"  Pt states he had some pain last night. Pt states OS feels sore today.       Last edited by Edmon Crape, MD on 01/29/2020  8:53 AM. (History)      Referring physician: Jacqualine Mau, NP [address] [place],  [place] [address]  HISTORICAL INFORMATION:   Selected notes from the MEDICAL RECORD NUMBER       CURRENT MEDICATIONS: Current Outpatient Medications (Ophthalmic Drugs)  Medication Sig  . ofloxacin (OCUFLOX) 0.3 % ophthalmic solution Place 1 drop into the right eye 4 (four) times daily for 21 days.  . prednisoLONE acetate (PRED FORTE) 1 % ophthalmic suspension Place 1 drop into the right eye 4 (four) times daily.   No current facility-administered medications for this visit. (Ophthalmic Drugs)   Current Outpatient Medications (Other)  Medication Sig  . amLODipine (NORVASC) 5 MG tablet Take 5 mg by mouth daily.  Marland Kitchen atorvastatin (LIPITOR) 20 MG tablet Take 5 mg by mouth daily.   . clobetasol cream (TEMOVATE) 0.05 % Apply 1 application topically 2 (two) times daily as needed. For rash. (not to face, groin, or underarms)  . docusate sodium (COLACE) 100 MG capsule Take 100 mg by mouth 2 (two) times daily.  . hydrochlorothiazide (MICROZIDE) 12.5 MG capsule Take 12.5 mg by mouth daily.  . meloxicam (MOBIC) 7.5 MG tablet Take 7.5 mg by mouth 2 (two) times daily as needed for pain.  . methocarbamol (ROBAXIN) 500 MG tablet Take 1 tablet (500 mg total) by mouth every 6 (six) hours as needed for muscle spasms. (Patient not taking: Reported on 01/04/2017)   . oxyCODONE (OXY IR/ROXICODONE) 5 MG immediate release tablet Take 1-2 tablets (5-10 mg total) by mouth every 3 (three) hours as needed for moderate pain or severe pain. (Patient not taking: Reported on 01/04/2017)  . rivaroxaban (XARELTO) 10 MG TABS tablet Take 1 tablet (10 mg total) by mouth daily with breakfast. Take Xarelto for two and a half more weeks, then discontinue Xarelto. Once the patient has completed the Xarelto, they may resume the 81 mg Aspirin. (Patient not taking: Reported on 01/04/2017)  . traMADol (ULTRAM) 50 MG tablet Take 1-2 tablets (50-100 mg total) by mouth every 6 (six) hours as needed (mild pain). (Patient not taking: Reported on 01/04/2017)  . valsartan (DIOVAN) 160 MG tablet Take 160 mg by mouth daily.   No current facility-administered medications for this visit. (Other)      REVIEW OF SYSTEMS:    ALLERGIES Allergies  Allergen Reactions  . Latex Other (See Comments)    Skin sensitivity   . Diclofenac Sodium Itching and Rash    PAST MEDICAL HISTORY Past Medical History:  Diagnosis Date  . Arthritis   . GERD (gastroesophageal reflux disease)   . History of hiatal hernia   . HTN (hypertension)   . Wears glasses    Past Surgical History:  Procedure Laterality Date  . ANKLE FRACTURE SURGERY  left / 2005   . EYE SURGERY Right 01/28/2020   Vitrectomy, Dr. Luciana Axe  . KNEE SURGERY     scoped 1994/left   . TOTAL HIP ARTHROPLASTY Left 07/07/2015   Procedure: LEFT TOTAL HIP ARTHROPLASTY ANTERIOR APPROACH;  Surgeon: Ollen Gross, MD;  Location: [facility];  Service: Orthopedics;  Laterality: Left;  Marland Kitchen VASECTOMY      FAMILY HISTORY Family History  Problem Relation Age of Onset  . Heart disease Other     SOCIAL HISTORY Social History   Tobacco Use  . Smoking status: Never Smoker  . Smokeless tobacco: Never Used  Substance Use Topics  . Alcohol use: No  . Drug use: No         OPHTHALMIC EXAM:  Base Eye Exam    Visual Acuity (Snellen -  Linear)      Right Left   Dist [place] 20/20 20/30 -2       Tonometry (Tonopen, 8:30 AM)      Right Left   Pressure 07 08       Pupils      Dark Light Shape React   Right 6 6 Round Dilated   Left           Neuro/Psych    Oriented x3: Yes   Mood/Affect: Normal        Slit Lamp and Fundus Exam    External Exam      Right Left   External Normal Normal       Slit Lamp Exam      Right Left   Lids/Lashes Normal Normal   Conjunctiva/Sclera White and quiet White and quiet   Cornea Clear Clear   Anterior Chamber Deep and quiet Deep and quiet   Iris Round and reactive Round and reactive   Lens Posterior chamber intraocular lens Posterior chamber intraocular lens   Anterior Vitreous Normal Normal       Fundus Exam      Right Left   Posterior Vitreous Vitrectomized, clear    Disc Normal    C/D Ratio 0.6    Macula Normal    Vessels Normal    Periphery Good retinopexy superiorly, scleral indentation visible, from buckle           IMAGING AND PROCEDURES  Imaging and Procedures for 01/29/20           ASSESSMENT/PLAN:  Vitreous membranes and strands, right No lifting and bending for 1 week. No water in the eye for 10 days. Do not rub the eye. Wear shield at night for 1-3 days.  Wear your CPAP as normal, if instructed by your doctor.  Continue your topical medications for a total of 3 weeks.  Do not refill your postoperative medications unless instructed.    Vitreous membranes or strands, left Dilate OD S next week and evaluate      ICD-10-CM   1. Follow-up examination after eye surgery  Z09   2. Vitreous membranes and strands, right  H43.311   3. Vitreous membranes or strands, left  H43.312     1.  Patient is to restart topical medications OD, ofloxacin 1 drop 4 times daily and prednisolone acetate 1 drop 4 times daily  2.  Patient follow-up in 1 week where we will dilate OU.  3.  Has complaints of vitreous floaters in the left eye that are hampering his  activities of daily living and wishes evaluation and consideration of therapy.  Ophthalmic Meds Ordered this visit:  No orders  of the defined types were placed in this encounter.      Return in about 1 week (around 02/05/2020) for DILATE OU, OCT, COLOR FP.  There are no Patient Instructions on file for this visit.   Explained the diagnoses, plan, and follow up with the patient and they expressed understanding.  Patient expressed understanding of the importance of proper follow up care.   Alford Highland Collette Pescador M.D. Diseases & Surgery of the Retina and Vitreous [facility] 01/29/20     Abbreviations: M myopia (nearsighted); A astigmatism; H hyperopia (farsighted); P presbyopia; Mrx spectacle prescription;  CTL contact lenses; OD right eye; OS left eye; OU both eyes  XT exotropia; ET esotropia; PEK punctate epithelial keratitis; PEE punctate epithelial erosions; DES dry eye syndrome; MGD meibomian gland dysfunction; ATs artificial tears; PFAT's preservative free artificial tears; NSC nuclear sclerotic cataract; PSC posterior subcapsular cataract; ERM epi-retinal membrane; PVD posterior vitreous detachment; RD retinal detachment; DM diabetes mellitus; DR diabetic retinopathy; NPDR non-proliferative diabetic retinopathy; PDR proliferative diabetic retinopathy; CSME clinically significant macular edema; DME diabetic macular edema; dbh dot blot hemorrhages; CWS cotton wool spot; POAG primary open angle glaucoma; C/D cup-to-disc ratio; HVF humphrey visual field; GVF goldmann visual field; OCT optical coherence tomography; IOP intraocular pressure; BRVO Branch retinal vein occlusion; CRVO central retinal vein occlusion; CRAO central retinal artery occlusion; BRAO branch retinal artery occlusion; RT retinal tear; SB scleral buckle; PPV pars plana vitrectomy; VH Vitreous hemorrhage; PRP panretinal laser photocoagulation; IVK intravitreal kenalog; VMT vitreomacular traction; MH Macular hole;   NVD neovascularization of the disc; NVE neovascularization elsewhere; AREDS age related eye disease study; ARMD age related macular degeneration; POAG primary open angle glaucoma; EBMD epithelial/anterior basement membrane dystrophy; ACIOL anterior chamber intraocular lens; IOL intraocular lens; PCIOL posterior chamber intraocular lens; Phaco/IOL phacoemulsification with intraocular lens placement; PRK photorefractive keratectomy; LASIK laser assisted in situ keratomileusis; HTN hypertension; DM diabetes mellitus; COPD chronic obstructive pulmonary disease

## 2020-01-29 NOTE — Assessment & Plan Note (Signed)
No lifting and bending for 1 week. No water in the eye for 10 days. Do not rub the eye. Wear shield at night for 1-3 days.  Wear your CPAP as normal, if instructed by your doctor.  Continue your topical medications for a total of 3 weeks.  Do not refill your postoperative medications unless instructed.   

## 2020-02-03 ENCOUNTER — Other Ambulatory Visit: Payer: Self-pay

## 2020-02-03 ENCOUNTER — Encounter (INDEPENDENT_AMBULATORY_CARE_PROVIDER_SITE_OTHER): Payer: Self-pay | Admitting: Ophthalmology

## 2020-02-03 ENCOUNTER — Ambulatory Visit (INDEPENDENT_AMBULATORY_CARE_PROVIDER_SITE_OTHER): Payer: 59 | Admitting: Ophthalmology

## 2020-02-03 DIAGNOSIS — H43312 Vitreous membranes and strands, left eye: Secondary | ICD-10-CM | POA: Diagnosis not present

## 2020-02-03 DIAGNOSIS — H43311 Vitreous membranes and strands, right eye: Secondary | ICD-10-CM | POA: Diagnosis not present

## 2020-02-03 MED ORDER — PREDNISOLONE ACETATE 1 % OP SUSP
1.0000 [drp] | Freq: Four times a day (QID) | OPHTHALMIC | 0 refills | Status: DC
Start: 2020-02-03 — End: 2020-12-07

## 2020-02-03 MED ORDER — OFLOXACIN 0.3 % OP SOLN
1.0000 [drp] | Freq: Four times a day (QID) | OPHTHALMIC | 0 refills | Status: DC
Start: 2020-02-03 — End: 2020-12-07

## 2020-02-03 NOTE — Progress Notes (Signed)
02/03/2020     CHIEF COMPLAINT Patient presents for Post-op Follow-up   HISTORY OF PRESENT ILLNESS: Jake Clark is a 64 y.o. male who presents to the clinic today for:   HPI    Post-op Follow-up    In right eye.  Discomfort includes Negative for pain and itching.  Vision is stable.          Comments    1 week follow up - OCT OU, FP OU, Pre-op OS Pt states that he thinks that the floaters have gotten worse in OS. Pt states OD is doing well.       Last edited by Berenice Bouton on 02/03/2020  3:48 PM. (History)        HISTORICAL INFORMATION:   Selected notes from the MEDICAL RECORD NUMBER       CURRENT MEDICATIONS: Current Outpatient Medications (Ophthalmic Drugs)  Medication Sig  . ofloxacin (OCUFLOX) 0.3 % ophthalmic solution Place 1 drop into the right eye 4 (four) times daily for 21 days.  . prednisoLONE acetate (PRED FORTE) 1 % ophthalmic suspension Place 1 drop into the right eye 4 (four) times daily.   No current facility-administered medications for this visit. (Ophthalmic Drugs)   Current Outpatient Medications (Other)  Medication Sig  . amLODipine (NORVASC) 5 MG tablet Take 5 mg by mouth daily.  Marland Kitchen atorvastatin (LIPITOR) 20 MG tablet Take 5 mg by mouth daily.   . clobetasol cream (TEMOVATE) 0.05 % Apply 1 application topically 2 (two) times daily as needed. For rash. (not to face, groin, or underarms)  . docusate sodium (COLACE) 100 MG capsule Take 100 mg by mouth 2 (two) times daily.  . hydrochlorothiazide (MICROZIDE) 12.5 MG capsule Take 12.5 mg by mouth daily.  . meloxicam (MOBIC) 7.5 MG tablet Take 7.5 mg by mouth 2 (two) times daily as needed for pain.  . methocarbamol (ROBAXIN) 500 MG tablet Take 1 tablet (500 mg total) by mouth every 6 (six) hours as needed for muscle spasms. (Patient not taking: Reported on 01/04/2017)  . oxyCODONE (OXY IR/ROXICODONE) 5 MG immediate release tablet Take 1-2 tablets (5-10 mg total) by mouth every 3 (three) hours as  needed for moderate pain or severe pain. (Patient not taking: Reported on 01/04/2017)  . rivaroxaban (XARELTO) 10 MG TABS tablet Take 1 tablet (10 mg total) by mouth daily with breakfast. Take Xarelto for two and a half more weeks, then discontinue Xarelto. Once the patient has completed the Xarelto, they may resume the 81 mg Aspirin. (Patient not taking: Reported on 01/04/2017)  . traMADol (ULTRAM) 50 MG tablet Take 1-2 tablets (50-100 mg total) by mouth every 6 (six) hours as needed (mild pain). (Patient not taking: Reported on 01/04/2017)  . valsartan (DIOVAN) 160 MG tablet Take 160 mg by mouth daily.   No current facility-administered medications for this visit. (Other)     ALLERGIES Allergies  Allergen Reactions  . Latex Other (See Comments)    Skin sensitivity   . Diclofenac Sodium Itching and Rash    PAST MEDICAL HISTORY Past Medical History:  Diagnosis Date  . Arthritis   . GERD (gastroesophageal reflux disease)   . History of hiatal hernia   . HTN (hypertension)   . Wears glasses    Past Surgical History:  Procedure Laterality Date  . ANKLE FRACTURE SURGERY     left / 2005   . EYE SURGERY Right 01/28/2020   Vitrectomy, Dr. Luciana Axe  . KNEE SURGERY     scoped  1994/left   . TOTAL HIP ARTHROPLASTY Left 07/07/2015   Procedure: LEFT TOTAL HIP ARTHROPLASTY ANTERIOR APPROACH;  Surgeon: Ollen Gross, MD;  Location: [facility];  Service: Orthopedics;  Laterality: Left;  Marland Kitchen VASECTOMY      FAMILY HISTORY Family History  Problem Relation Age of Onset  . Heart disease Other     SOCIAL HISTORY Social History   Tobacco Use  . Smoking status: Never Smoker  . Smokeless tobacco: Never Used  Substance Use Topics  . Alcohol use: No  . Drug use: No         OPHTHALMIC EXAM: Base Eye Exam    Visual Acuity (Snellen - Linear)      Right Left   Dist [place] 20/30 20/40   Dist ph [place]  20/30       Tonometry (Tonopen, 3:54 PM)      Right Left   Pressure 8 12       Neuro/Psych     Oriented x3: Yes   Mood/Affect: Normal       Dilation    Both eyes: 1.0% Mydriacyl, 2.5% Phenylephrine @ 3:54 PM        Slit Lamp and Fundus Exam    External Exam      Right Left   External Normal Normal       Slit Lamp Exam      Right Left   Lids/Lashes Normal Normal   Conjunctiva/Sclera White and quiet White and quiet   Cornea Clear Clear   Anterior Chamber Deep and quiet Deep and quiet   Iris Round and reactive Round and reactive   Lens Posterior chamber intraocular lens Posterior chamber intraocular lens   Vitreous Normal Normal          IMAGING AND PROCEDURES  Imaging and Procedures for @TODAY @           ASSESSMENT/PLAN:  No diagnosis found.  Ophthalmic Meds Ordered this visit:  No orders of the defined types were placed in this encounter.       Pre-op completed. Operative consent obtained with pre-op eye drops reviewed with and sent via [employer] as needed. Post op instructions reviewed with patient and per patient all questions answered.  [facility]

## 2020-02-03 NOTE — Addendum Note (Signed)
Addended by: Fawn Kirk A on: 02/03/2020 04:22 PM   Modules accepted: Level of Service

## 2020-02-03 NOTE — Assessment & Plan Note (Signed)
Reports vitreous strands and floaters in the left eye that are worse than the right eye in the past.  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

## 2020-02-03 NOTE — Progress Notes (Signed)
02/03/2020     CHIEF COMPLAINT Patient presents for Post-op Follow-up   HISTORY OF PRESENT ILLNESS: Jake Clark is a 64 y.o. male who presents to the clinic today for:   HPI    Post-op Follow-up    In right eye.  Discomfort includes Negative for pain and itching.  Vision is stable.          Comments    1 week follow up - OCT OU, FP OU, Pre-op OS Pt states that he thinks that the floaters have gotten worse in OS the strands of the fibers of the left eye impact his vision worse than those that occurred in the right eye.  Pt states OD is doing well.  Can now see clearly OD without the strands and membranes Hampering his quality life       Last edited by Edmon Crape, MD on 02/03/2020  4:17 PM. (History)      Referring physician: Jacqualine Mau, NP [address] [place],  [place] [address]  HISTORICAL INFORMATION:   Selected notes from the MEDICAL RECORD NUMBER       CURRENT MEDICATIONS: Current Outpatient Medications (Ophthalmic Drugs)  Medication Sig  . ofloxacin (OCUFLOX) 0.3 % ophthalmic solution Place 1 drop into the right eye 4 (four) times daily for 21 days.  Marland Kitchen ofloxacin (OCUFLOX) 0.3 % ophthalmic solution Place 1 drop into the left eye 4 (four) times daily.  . prednisoLONE acetate (PRED FORTE) 1 % ophthalmic suspension Place 1 drop into the right eye 4 (four) times daily.  . prednisoLONE acetate (PRED FORTE) 1 % ophthalmic suspension Place 1 drop into the left eye 4 (four) times daily.   No current facility-administered medications for this visit. (Ophthalmic Drugs)   Current Outpatient Medications (Other)  Medication Sig  . amLODipine (NORVASC) 5 MG tablet Take 5 mg by mouth daily.  Marland Kitchen atorvastatin (LIPITOR) 20 MG tablet Take 5 mg by mouth daily.   . clobetasol cream (TEMOVATE) 0.05 % Apply 1 application topically 2 (two) times daily as needed. For rash. (not to face, groin, or underarms)  . docusate sodium (COLACE) 100 MG capsule Take 100 mg  by mouth 2 (two) times daily.  . hydrochlorothiazide (MICROZIDE) 12.5 MG capsule Take 12.5 mg by mouth daily.  . meloxicam (MOBIC) 7.5 MG tablet Take 7.5 mg by mouth 2 (two) times daily as needed for pain.  . methocarbamol (ROBAXIN) 500 MG tablet Take 1 tablet (500 mg total) by mouth every 6 (six) hours as needed for muscle spasms. (Patient not taking: Reported on 01/04/2017)  . oxyCODONE (OXY IR/ROXICODONE) 5 MG immediate release tablet Take 1-2 tablets (5-10 mg total) by mouth every 3 (three) hours as needed for moderate pain or severe pain. (Patient not taking: Reported on 01/04/2017)  . rivaroxaban (XARELTO) 10 MG TABS tablet Take 1 tablet (10 mg total) by mouth daily with breakfast. Take Xarelto for two and a half more weeks, then discontinue Xarelto. Once the patient has completed the Xarelto, they may resume the 81 mg Aspirin. (Patient not taking: Reported on 01/04/2017)  . traMADol (ULTRAM) 50 MG tablet Take 1-2 tablets (50-100 mg total) by mouth every 6 (six) hours as needed (mild pain). (Patient not taking: Reported on 01/04/2017)  . valsartan (DIOVAN) 160 MG tablet Take 160 mg by mouth daily.   No current facility-administered medications for this visit. (Other)      REVIEW OF SYSTEMS:    ALLERGIES Allergies  Allergen Reactions  . Latex Other (See Comments)    Skin sensitivity   . Diclofenac Sodium Itching and Rash    PAST MEDICAL HISTORY Past Medical History:  Diagnosis Date  . Arthritis   . GERD (gastroesophageal reflux disease)   . History of hiatal hernia   . HTN (hypertension)   . Wears glasses    Past Surgical History:  Procedure Laterality Date  . ANKLE FRACTURE SURGERY     left / 2005   . EYE SURGERY Right 01/28/2020   Vitrectomy, Dr. Luciana Axe  . KNEE SURGERY     scoped 1994/left   . TOTAL HIP ARTHROPLASTY Left 07/07/2015   Procedure: LEFT TOTAL HIP ARTHROPLASTY ANTERIOR APPROACH;  Surgeon: Ollen Gross, MD;  Location: [facility];  Service: Orthopedics;   Laterality: Left;  Marland Kitchen VASECTOMY      FAMILY HISTORY Family History  Problem Relation Age of Onset  . Heart disease Other     SOCIAL HISTORY Social History   Tobacco Use  . Smoking status: Never Smoker  . Smokeless tobacco: Never Used  Substance Use Topics  . Alcohol use: No  . Drug use: No         OPHTHALMIC EXAM:  Base Eye Exam    Visual Acuity (Snellen - Linear)      Right Left   Dist [name] 20/30 20/40   Dist ph [name]  20/30       Tonometry (Tonopen, 3:54 PM)      Right Left   Pressure 8 12       Neuro/Psych    Oriented x3: Yes   Mood/Affect: Normal       Dilation    Both eyes: 1.0% Mydriacyl, 2.5% Phenylephrine @ 3:54 PM        Slit Lamp and Fundus Exam    External Exam      Right Left   External Normal Normal       Slit Lamp Exam      Right Left   Lids/Lashes Normal Normal   Conjunctiva/Sclera White and quiet White and quiet   Cornea Clear Clear   Anterior Chamber Deep and quiet Deep and quiet   Iris Round and reactive Round and reactive   Lens Posterior chamber intraocular lens Posterior chamber intraocular lens   Anterior Vitreous Normal Normal       Fundus Exam      Right Left   Posterior Vitreous Vitrectomized, clear Central vitreous floaters   Disc Normal Normal   C/D Ratio 0.55 0.55   Macula Normal Normal   Vessels Normal Normal   Periphery Good retinopexy superiorly, scleral indentation visible, from buckle Normal          IMAGING AND PROCEDURES  Imaging and Procedures for 02/03/20  OCT, Retina - OU - Both Eyes       Right Eye Quality was good. Scan locations included subfoveal. Central Foveal Thickness: 346. Findings include epiretinal membrane, abnormal foveal contour.   Left Eye Quality was good. Scan locations included subfoveal. Central Foveal Thickness: 298. Progression has been stable. Findings include normal foveal contour.   Notes OD: clear media OS: vit strands       Color Fundus Photography Optos - OU -  Both Eyes       Right Eye Progression has improved. Disc findings include normal observations. Macula : normal observations. Vessels : normal observations.   Left Eye Progression has been stable. Disc findings include normal observations. Macula : normal observations. Vessels : normal  observations.   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  Chorioretinal scarring temporally from prior retinopexy                ASSESSMENT/PLAN:  Vitreous membranes and strands, right The condition resolved and symptoms improved dramatically 1 week post vitrectomy  Vitreous membranes or strands, left Reports vitreous strands and floaters in the left eye that are worse than the right eye in the past.  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 Vitreous membranes or strands, left  H43.312 OCT, Retina - OU - Both Eyes    Color Fundus Photography Optos - OU - Both Eyes  2. Vitreous membranes and strands, right  H43.311     1.  To the operating room tomorrow left eye for vitrectomy focal laser  2.  3.  Ophthalmic Meds Ordered this visit:  Meds ordered this encounter  Medications  . prednisoLONE acetate (PRED FORTE) 1 % ophthalmic suspension    Sig: Place 1 drop into the left eye 4 (four) times daily.    Dispense:  5 mL    Refill:  0  . ofloxacin (OCUFLOX) 0.3 % ophthalmic solution    Sig: Place 1 drop into the left eye 4 (four) times daily.    Dispense:  5 mL    Refill:  0       Return for Plan vitrectomy left eye with focal endolaser, [facility].  There are no Patient Instructions on file for this visit.   Explained  the diagnoses, plan, and follow up with the patient and they expressed understanding.  Patient expressed understanding of the importance of proper follow up care.   Alford Highland Bodin Gorka M.D. Diseases & Surgery of the Retina and Vitreous [facility] 02/03/20     Abbreviations: M myopia (nearsighted); A astigmatism; H hyperopia (farsighted); P presbyopia; Mrx spectacle prescription;  CTL contact lenses; OD right eye; OS left eye; OU both eyes  XT exotropia; ET esotropia; PEK punctate epithelial keratitis; PEE punctate epithelial erosions; DES dry eye syndrome; MGD meibomian gland dysfunction; ATs artificial tears; PFAT's preservative free artificial tears; NSC nuclear sclerotic cataract; PSC posterior subcapsular cataract; ERM epi-retinal membrane; PVD posterior vitreous detachment; RD retinal detachment; DM diabetes mellitus; DR diabetic retinopathy; NPDR non-proliferative diabetic retinopathy; PDR proliferative diabetic retinopathy; CSME clinically significant macular edema; DME diabetic macular edema; dbh dot blot hemorrhages; CWS cotton wool spot; POAG primary open angle glaucoma; C/D cup-to-disc ratio; HVF humphrey visual field; GVF goldmann visual field; OCT optical coherence tomography; IOP intraocular pressure; BRVO Branch retinal vein occlusion; CRVO central retinal vein occlusion; CRAO central retinal artery occlusion; BRAO branch retinal artery occlusion; RT retinal tear; SB scleral buckle; PPV pars plana vitrectomy; VH Vitreous hemorrhage; PRP panretinal laser photocoagulation; IVK intravitreal kenalog; VMT vitreomacular traction; MH Macular hole;  NVD neovascularization of the disc; NVE neovascularization elsewhere; AREDS age related eye disease study; ARMD age related macular degeneration; POAG primary open angle glaucoma; EBMD epithelial/anterior basement membrane dystrophy; ACIOL anterior chamber intraocular lens; IOL intraocular lens; PCIOL posterior chamber intraocular lens;  Phaco/IOL phacoemulsification with intraocular lens placement; PRK photorefractive keratectomy; LASIK laser assisted in situ keratomileusis;  HTN hypertension; DM diabetes mellitus; COPD chronic obstructive pulmonary disease

## 2020-02-03 NOTE — Assessment & Plan Note (Signed)
The condition resolved and symptoms improved dramatically 1 week post vitrectomy

## 2020-02-04 ENCOUNTER — Encounter (INDEPENDENT_AMBULATORY_CARE_PROVIDER_SITE_OTHER): Payer: 59 | Admitting: Ophthalmology

## 2020-02-04 DIAGNOSIS — H43312 Vitreous membranes and strands, left eye: Secondary | ICD-10-CM

## 2020-02-04 DIAGNOSIS — H33002 Unspecified retinal detachment with retinal break, left eye: Secondary | ICD-10-CM

## 2020-02-05 ENCOUNTER — Encounter (INDEPENDENT_AMBULATORY_CARE_PROVIDER_SITE_OTHER): Payer: Self-pay | Admitting: Ophthalmology

## 2020-02-05 ENCOUNTER — Other Ambulatory Visit: Payer: Self-pay

## 2020-02-05 ENCOUNTER — Ambulatory Visit (INDEPENDENT_AMBULATORY_CARE_PROVIDER_SITE_OTHER): Payer: 59 | Admitting: Ophthalmology

## 2020-02-05 ENCOUNTER — Encounter (INDEPENDENT_AMBULATORY_CARE_PROVIDER_SITE_OTHER): Payer: 59 | Admitting: Ophthalmology

## 2020-02-05 DIAGNOSIS — H33022 Retinal detachment with multiple breaks, left eye: Secondary | ICD-10-CM

## 2020-02-05 DIAGNOSIS — H43312 Vitreous membranes and strands, left eye: Secondary | ICD-10-CM

## 2020-02-05 NOTE — Assessment & Plan Note (Signed)
1 day status post vitrectomy, focal laser left eye for vitreous membranes and strands, visual incapacitating, and prior retinal detachment looks great today

## 2020-02-05 NOTE — Progress Notes (Signed)
02/05/2020     CHIEF COMPLAINT Patient presents for Post-op Follow-up   HISTORY OF PRESENT ILLNESS: Jake Clark is a 64 y.o. male who presents to the clinic today for:   HPI    Post-op Follow-up    In left eye.  Discomfort includes pain.          Comments    1 Day POV OS s/p vitrectomy  And focal laser for holes, for vit membranes and strands,, and symptomatically with many few floaters and webs this morning upon removal of the patch  Pt reports some scratchiness and "gritty" sensation OS. No nausea or vomiting following sx.       Last edited by Edmon Crape, MD on 02/05/2020  9:01 AM. (History)      Referring physician: Jacqualine Mau, NP [address] [place],  [place] [address]  HISTORICAL INFORMATION:   Selected notes from the MEDICAL RECORD NUMBER       CURRENT MEDICATIONS: Current Outpatient Medications (Ophthalmic Drugs)  Medication Sig   ofloxacin (OCUFLOX) 0.3 % ophthalmic solution Place 1 drop into the right eye 4 (four) times daily for 21 days.   ofloxacin (OCUFLOX) 0.3 % ophthalmic solution Place 1 drop into the left eye 4 (four) times daily.   prednisoLONE acetate (PRED FORTE) 1 % ophthalmic suspension Place 1 drop into the right eye 4 (four) times daily.   prednisoLONE acetate (PRED FORTE) 1 % ophthalmic suspension Place 1 drop into the left eye 4 (four) times daily.   No current facility-administered medications for this visit. (Ophthalmic Drugs)   Current Outpatient Medications (Other)  Medication Sig   amLODipine (NORVASC) 5 MG tablet Take 5 mg by mouth daily.   atorvastatin (LIPITOR) 20 MG tablet Take 5 mg by mouth daily.    clobetasol cream (TEMOVATE) 0.05 % Apply 1 application topically 2 (two) times daily as needed. For rash. (not to face, groin, or underarms)   docusate sodium (COLACE) 100 MG capsule Take 100 mg by mouth 2 (two) times daily.   hydrochlorothiazide (MICROZIDE) 12.5 MG capsule Take 12.5 mg by mouth  daily.   meloxicam (MOBIC) 7.5 MG tablet Take 7.5 mg by mouth 2 (two) times daily as needed for pain.   methocarbamol (ROBAXIN) 500 MG tablet Take 1 tablet (500 mg total) by mouth every 6 (six) hours as needed for muscle spasms. (Patient not taking: Reported on 01/04/2017)   oxyCODONE (OXY IR/ROXICODONE) 5 MG immediate release tablet Take 1-2 tablets (5-10 mg total) by mouth every 3 (three) hours as needed for moderate pain or severe pain. (Patient not taking: Reported on 01/04/2017)   rivaroxaban (XARELTO) 10 MG TABS tablet Take 1 tablet (10 mg total) by mouth daily with breakfast. Take Xarelto for two and a half more weeks, then discontinue Xarelto. Once the patient has completed the Xarelto, they may resume the 81 mg Aspirin. (Patient not taking: Reported on 01/04/2017)   traMADol (ULTRAM) 50 MG tablet Take 1-2 tablets (50-100 mg total) by mouth every 6 (six) hours as needed (mild pain). (Patient not taking: Reported on 01/04/2017)   valsartan (DIOVAN) 160 MG tablet Take 160 mg by mouth daily.   No current facility-administered medications for this visit. (Other)      REVIEW OF SYSTEMS:    ALLERGIES Allergies  Allergen Reactions   Latex Other (See Comments)    Skin sensitivity    Diclofenac Sodium Itching and Rash    PAST MEDICAL HISTORY Past  Medical History:  Diagnosis Date   Arthritis    GERD (gastroesophageal reflux disease)    History of hiatal hernia    HTN (hypertension)    Wears glasses    Past Surgical History:  Procedure Laterality Date   ANKLE FRACTURE SURGERY     left / 2005    EYE SURGERY Right 01/28/2020   Vitrectomy, Dr. Luciana Axe   KNEE SURGERY     scoped 1994/left    TOTAL HIP ARTHROPLASTY Left 07/07/2015   Procedure: LEFT TOTAL HIP ARTHROPLASTY ANTERIOR APPROACH;  Surgeon: Ollen Gross, MD;  Location: [facility];  Service: Orthopedics;  Laterality: Left;   VASECTOMY      FAMILY HISTORY Family History  Problem Relation Age of Onset   Heart  disease Other     SOCIAL HISTORY Social History   Tobacco Use   Smoking status: Never Smoker   Smokeless tobacco: Never Used  Substance Use Topics   Alcohol use: No   Drug use: No         OPHTHALMIC EXAM:  Base Eye Exam    Visual Acuity (ETDRS)      Right Left   Dist [place] 20/20 -1 20/20       Tonometry (Tonopen, 8:30 AM)      Right Left   Pressure 11 06       Pupils      Dark Light Shape React APD   Right 4 3 Round Brisk None   Left 7 7 Round dilated None       Visual Fields (Counting fingers)      Left Right    Full Full       Extraocular Movement      Right Left    Full Full       Neuro/Psych    Oriented x3: Yes   Mood/Affect: Normal       Dilation    Left eye: 1.0% Mydriacyl, 2.5% Phenylephrine @ 8:31 AM        Slit Lamp and Fundus Exam    External Exam      Right Left   External Normal Normal       Slit Lamp Exam      Right Left   Lids/Lashes Normal Normal   Conjunctiva/Sclera White and quiet White and quiet   Cornea Clear Clear   Anterior Chamber Deep and quiet Deep and quiet   Iris Round and reactive Round and reactive   Lens Posterior chamber intraocular lens Posterior chamber intraocular lens   Anterior Vitreous Normal Normal       Fundus Exam      Right Left   Posterior Vitreous  Clear, vitrectomized   Disc Normal Normal   C/D Ratio  0.55   Macula Normal Normal   Vessels Normal Normal   Periphery  Normal, good retinopexy, a attached          IMAGING AND PROCEDURES  Imaging and Procedures for 02/05/20           ASSESSMENT/PLAN:  Vitreous membranes or strands, left 1 day status post vitrectomy, focal laser left eye for vitreous membranes and strands, visual incapacitating, and prior retinal detachment looks great today      ICD-10-CM   1. Vitreous membranes or strands, left  H43.312     1.  Postop day #1 vitrectomy, focal laser left eye for vitreous membranes and strands and prior retinal to tears.   Symptomatically vastly improved with many fewer floaters and now in clinic  enhanced and clear vision.  2.  OD, 8 days status post vitrectomy and also doing extremely well visually and symptomatically  3.  OD continue the drops for 2 more weeks  OS continue the drops for 3 more weeks  Ophthalmic Meds Ordered this visit:  No orders of the defined types were placed in this encounter.      Return in about 1 week (around 02/12/2020) for OS, dilate, COLOR FP.  There are no Patient Instructions on file for this visit.   Explained the diagnoses, plan, and follow up with the patient and they expressed understanding.  Patient expressed understanding of the importance of proper follow up care.   Alford Highland Andrus Sharp M.D. Diseases & Surgery of the Retina and Vitreous [facility] 02/05/20     Abbreviations: M myopia (nearsighted); A astigmatism; H hyperopia (farsighted); P presbyopia; Mrx spectacle prescription;  CTL contact lenses; OD right eye; OS left eye; OU both eyes  XT exotropia; ET esotropia; PEK punctate epithelial keratitis; PEE punctate epithelial erosions; DES dry eye syndrome; MGD meibomian gland dysfunction; ATs artificial tears; PFAT's preservative free artificial tears; NSC nuclear sclerotic cataract; PSC posterior subcapsular cataract; ERM epi-retinal membrane; PVD posterior vitreous detachment; RD retinal detachment; DM diabetes mellitus; DR diabetic retinopathy; NPDR non-proliferative diabetic retinopathy; PDR proliferative diabetic retinopathy; CSME clinically significant macular edema; DME diabetic macular edema; dbh dot blot hemorrhages; CWS cotton wool spot; POAG primary open angle glaucoma; C/D cup-to-disc ratio; HVF humphrey visual field; GVF goldmann visual field; OCT optical coherence tomography; IOP intraocular pressure; BRVO Branch retinal vein occlusion; CRVO central retinal vein occlusion; CRAO central retinal artery occlusion; BRAO branch retinal artery  occlusion; RT retinal tear; SB scleral buckle; PPV pars plana vitrectomy; VH Vitreous hemorrhage; PRP panretinal laser photocoagulation; IVK intravitreal kenalog; VMT vitreomacular traction; MH Macular hole;  NVD neovascularization of the disc; NVE neovascularization elsewhere; AREDS age related eye disease study; ARMD age related macular degeneration; POAG primary open angle glaucoma; EBMD epithelial/anterior basement membrane dystrophy; ACIOL anterior chamber intraocular lens; IOL intraocular lens; PCIOL posterior chamber intraocular lens; Phaco/IOL phacoemulsification with intraocular lens placement; PRK photorefractive keratectomy; LASIK laser assisted in situ keratomileusis; HTN hypertension; DM diabetes mellitus; COPD chronic obstructive pulmonary disease

## 2020-02-12 ENCOUNTER — Other Ambulatory Visit: Payer: Self-pay

## 2020-02-12 ENCOUNTER — Encounter (INDEPENDENT_AMBULATORY_CARE_PROVIDER_SITE_OTHER): Payer: Self-pay | Admitting: Ophthalmology

## 2020-02-12 ENCOUNTER — Ambulatory Visit (INDEPENDENT_AMBULATORY_CARE_PROVIDER_SITE_OTHER): Payer: 59 | Admitting: Ophthalmology

## 2020-02-12 DIAGNOSIS — H35372 Puckering of macula, left eye: Secondary | ICD-10-CM

## 2020-02-12 DIAGNOSIS — H43312 Vitreous membranes and strands, left eye: Secondary | ICD-10-CM

## 2020-02-12 DIAGNOSIS — H43311 Vitreous membranes and strands, right eye: Secondary | ICD-10-CM

## 2020-02-12 NOTE — Assessment & Plan Note (Signed)
1 week post vitrectomy, visual acuity is improved, patient no longer hampered by his vitreous in the central vision

## 2020-02-12 NOTE — Assessment & Plan Note (Signed)
Condition now resolved status post vitrectomy, 2 weeks prior.  Patient has no visual hindrances remaining

## 2020-02-12 NOTE — Progress Notes (Signed)
02/12/2020     CHIEF COMPLAINT Patient presents for Post-op Follow-up   HISTORY OF PRESENT ILLNESS: Jake Clark is a 64 y.o. male who presents to the clinic today for:   HPI    Post-op Follow-up    In left eye.  Vision is stable.          Comments    1 Week POV OS s/p vitrectomy, 2 weeks status post vitrectomy OD,  for vit strands and membranes  Pt denies noticeable changes to [place] OU since last visit. Pt denies ocular pain, flashes of light, or floaters OU. Pt sts everything is "good."  "All that goop is gone"       Last edited by Edmon Crape, MD on 02/12/2020  9:20 AM. (History)      Referring physician: Jacqualine Mau, NP [address] [place],  [place] [address]  HISTORICAL INFORMATION:   Selected notes from the MEDICAL RECORD NUMBER       CURRENT MEDICATIONS: Current Outpatient Medications (Ophthalmic Drugs)  Medication Sig  . ofloxacin (OCUFLOX) 0.3 % ophthalmic solution Place 1 drop into the left eye 4 (four) times daily.  . prednisoLONE acetate (PRED FORTE) 1 % ophthalmic suspension Place 1 drop into the right eye 4 (four) times daily.  . prednisoLONE acetate (PRED FORTE) 1 % ophthalmic suspension Place 1 drop into the left eye 4 (four) times daily.   No current facility-administered medications for this visit. (Ophthalmic Drugs)   Current Outpatient Medications (Other)  Medication Sig  . amLODipine (NORVASC) 5 MG tablet Take 5 mg by mouth daily.  Marland Kitchen atorvastatin (LIPITOR) 20 MG tablet Take 5 mg by mouth daily.   . clobetasol cream (TEMOVATE) 0.05 % Apply 1 application topically 2 (two) times daily as needed. For rash. (not to face, groin, or underarms)  . docusate sodium (COLACE) 100 MG capsule Take 100 mg by mouth 2 (two) times daily.  . hydrochlorothiazide (MICROZIDE) 12.5 MG capsule Take 12.5 mg by mouth daily.  . meloxicam (MOBIC) 7.5 MG tablet Take 7.5 mg by mouth 2 (two) times daily as needed for pain.  . methocarbamol (ROBAXIN)  500 MG tablet Take 1 tablet (500 mg total) by mouth every 6 (six) hours as needed for muscle spasms. (Patient not taking: Reported on 01/04/2017)  . oxyCODONE (OXY IR/ROXICODONE) 5 MG immediate release tablet Take 1-2 tablets (5-10 mg total) by mouth every 3 (three) hours as needed for moderate pain or severe pain. (Patient not taking: Reported on 01/04/2017)  . rivaroxaban (XARELTO) 10 MG TABS tablet Take 1 tablet (10 mg total) by mouth daily with breakfast. Take Xarelto for two and a half more weeks, then discontinue Xarelto. Once the patient has completed the Xarelto, they may resume the 81 mg Aspirin. (Patient not taking: Reported on 01/04/2017)  . traMADol (ULTRAM) 50 MG tablet Take 1-2 tablets (50-100 mg total) by mouth every 6 (six) hours as needed (mild pain). (Patient not taking: Reported on 01/04/2017)  . valsartan (DIOVAN) 160 MG tablet Take 160 mg by mouth daily.   No current facility-administered medications for this visit. (Other)      REVIEW OF SYSTEMS:    ALLERGIES Allergies  Allergen Reactions  . Latex Other (See Comments)    Skin sensitivity   . Diclofenac Sodium Itching and Rash    PAST MEDICAL HISTORY Past Medical History:  Diagnosis Date  . Arthritis   . GERD (gastroesophageal reflux disease)   . History  of hiatal hernia   . HTN (hypertension)   . Posterior vitreous detachment of left eye 01/19/2020  . 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 Vitreous floaters of right eye 12/10/2019   The nature of vitreous floaters was discussed with the patient as well as their frequent occurrence with development of posterior vitreous detachment. The significance of flashes and field cuts was discussed with the patient. The need for a dilated fundus examination was discussed and advice given to return immediately for new or different floaters .  More uncommonly, vitreous floaters, debris, or  . Vitreous membranes and strands, right 01/01/2020   The nature of vitreous  floaters was discussed with the patient as well as their frequent occurrence with development of posterior vitreous detachment. The significance of flashes and field cuts was discussed with the patient. The need for a dilated fundus examination was discussed and advice given to return immediately for new or different floaters .  More uncommonly, vitreous floaters, debris, or  . Vitreous membranes or strands, left 01/19/2020  . Wears glasses    Past Surgical History:  Procedure Laterality Date  . ANKLE FRACTURE SURGERY     left / 2005   . EYE SURGERY Right 01/28/2020   Vitrectomy, Dr. Luciana Axe  . KNEE SURGERY     scoped 1994/left   . TOTAL HIP ARTHROPLASTY Left 07/07/2015   Procedure: LEFT TOTAL HIP ARTHROPLASTY ANTERIOR APPROACH;  Surgeon: Ollen Gross, MD;  Location: [facility];  Service: Orthopedics;  Laterality: Left;  Marland Kitchen VASECTOMY      FAMILY HISTORY Family History  Problem Relation Age of Onset  . Heart disease Other     SOCIAL HISTORY Social History   Tobacco Use  . Smoking status: Never Smoker  . Smokeless tobacco: Never Used  Substance Use Topics  . Alcohol use: No  . Drug use: No         OPHTHALMIC EXAM:  Base Eye Exam    Visual Acuity (ETDRS)      Right Left   Dist [place] 20/20 20/25 -1       Tonometry (Tonopen, 8:34 AM)      Right Left   Pressure 13 15       Pupils      Pupils Dark Light Shape React APD   Right PERRL 4 3 Round Brisk None   Left PERRL 4 3 Round Brisk None       Visual Fields (Counting fingers)      Left Right    Full Full       Extraocular Movement      Right Left    Full Full       Neuro/Psych    Oriented x3: Yes   Mood/Affect: Normal       Dilation    Left eye: 1.0% Mydriacyl, 2.5% Phenylephrine @ 8:36 AM        Slit Lamp and Fundus Exam    External Exam      Right Left   External Normal Normal       Slit Lamp Exam      Right Left   Lids/Lashes Normal Normal   Conjunctiva/Sclera White and quiet, clearing  conjunctival hemorrhage White and quiet   Cornea Clear Clear   Anterior Chamber Deep and quiet Deep and quiet   Iris Round and reactive Round and reactive   Lens Posterior chamber intraocular lens Posterior chamber intraocular lens   Anterior Vitreous Normal Normal  Fundus Exam      Right Left   Posterior Vitreous  Clear, vitrectomized   Disc Normal Normal   C/D Ratio  0.55   Macula Normal Normal   Vessels Normal Normal   Periphery  Normal, good retinopexy,  attached          IMAGING AND PROCEDURES  Imaging and Procedures for 02/12/20  Color Fundus Photography Optos - OU - Both Eyes       Right Eye Progression has improved. Disc findings include normal observations. Macula : normal observations. Vessels : normal observations.   Left Eye Progression has improved. Disc findings include normal observations.   Notes OD, good retinopexy, retina attached clear media                ASSESSMENT/PLAN:  Vitreous membranes and strands, right Condition now resolved status post vitrectomy, 2 weeks prior.  Patient has no visual hindrances remaining  Vitreous membranes or strands, left 1 week post vitrectomy, visual acuity is improved, patient no longer hampered by his vitreous in the central vision  Left epiretinal membrane OS, minor will observe      ICD-10-CM   1. Vitreous membranes or strands, left  H43.312 Color Fundus Photography Optos - OU - Both Eyes  2. Vitreous membranes and strands, right  H43.311   3. Left epiretinal membrane  H35.372     1.  1 week status post vitrectomy left eye with improved visual clarity and improved acuity  2.  Week status post vitrectomy right eye with improved visual clarity and improved acuity  3.  Patient instructed to use topical medication for 1 more week of the right eye then Discontinue  4.  Patient instructed to use topical medication to the left eye for 2 more weeks  5 patient be cleared to return to work on  Monday to follow Ophthalmic Meds Ordered this visit:  No orders of the defined types were placed in this encounter.      No follow-ups on file.  There are no Patient Instructions on file for this visit.   Explained the diagnoses, plan, and follow up with the patient and they expressed understanding.  Patient expressed understanding of the importance of proper follow up care.   Alford Highland Gradie Ohm M.D. Diseases & Surgery of the Retina and Vitreous [facility] 02/12/20     Abbreviations: M myopia (nearsighted); A astigmatism; H hyperopia (farsighted); P presbyopia; Mrx spectacle prescription;  CTL contact lenses; OD right eye; OS left eye; OU both eyes  XT exotropia; ET esotropia; PEK punctate epithelial keratitis; PEE punctate epithelial erosions; DES dry eye syndrome; MGD meibomian gland dysfunction; ATs artificial tears; PFAT's preservative free artificial tears; NSC nuclear sclerotic cataract; PSC posterior subcapsular cataract; ERM epi-retinal membrane; PVD posterior vitreous detachment; RD retinal detachment; DM diabetes mellitus; DR diabetic retinopathy; NPDR non-proliferative diabetic retinopathy; PDR proliferative diabetic retinopathy; CSME clinically significant macular edema; DME diabetic macular edema; dbh dot blot hemorrhages; CWS cotton wool spot; POAG primary open angle glaucoma; C/D cup-to-disc ratio; HVF humphrey visual field; GVF goldmann visual field; OCT optical coherence tomography; IOP intraocular pressure; BRVO Branch retinal vein occlusion; CRVO central retinal vein occlusion; CRAO central retinal artery occlusion; BRAO branch retinal artery occlusion; RT retinal tear; SB scleral buckle; PPV pars plana vitrectomy; VH Vitreous hemorrhage; PRP panretinal laser photocoagulation; IVK intravitreal kenalog; VMT vitreomacular traction; MH Macular hole;  NVD neovascularization of the disc; NVE neovascularization elsewhere; AREDS age related eye disease study; ARMD age  related macular degeneration; POAG primary open angle glaucoma; EBMD epithelial/anterior basement membrane dystrophy; ACIOL anterior chamber intraocular lens; IOL intraocular lens; PCIOL posterior chamber intraocular lens; Phaco/IOL phacoemulsification with intraocular lens placement; [place] photorefractive keratectomy; LASIK laser assisted in situ keratomileusis; HTN hypertension; DM diabetes mellitus; COPD chronic obstructive pulmonary disease

## 2020-02-12 NOTE — Assessment & Plan Note (Signed)
OS, minor will observe 

## 2020-06-14 ENCOUNTER — Encounter (INDEPENDENT_AMBULATORY_CARE_PROVIDER_SITE_OTHER): Payer: 59 | Admitting: Ophthalmology

## 2020-06-16 ENCOUNTER — Other Ambulatory Visit: Payer: Self-pay

## 2020-06-16 ENCOUNTER — Ambulatory Visit (INDEPENDENT_AMBULATORY_CARE_PROVIDER_SITE_OTHER): Payer: 59 | Admitting: Ophthalmology

## 2020-06-16 ENCOUNTER — Encounter (INDEPENDENT_AMBULATORY_CARE_PROVIDER_SITE_OTHER): Payer: Self-pay | Admitting: Ophthalmology

## 2020-06-16 DIAGNOSIS — H43811 Vitreous degeneration, right eye: Secondary | ICD-10-CM | POA: Diagnosis not present

## 2020-06-16 DIAGNOSIS — H35371 Puckering of macula, right eye: Secondary | ICD-10-CM | POA: Diagnosis not present

## 2020-06-16 DIAGNOSIS — H43311 Vitreous membranes and strands, right eye: Secondary | ICD-10-CM

## 2020-06-16 DIAGNOSIS — H43312 Vitreous membranes and strands, left eye: Secondary | ICD-10-CM | POA: Diagnosis not present

## 2020-06-16 DIAGNOSIS — H35372 Puckering of macula, left eye: Secondary | ICD-10-CM

## 2020-06-16 NOTE — Assessment & Plan Note (Signed)
Minor, no impact on acuity will observe

## 2020-06-16 NOTE — Assessment & Plan Note (Signed)
Resolved post vitrectomy 

## 2020-06-16 NOTE — Progress Notes (Signed)
06/16/2020     CHIEF COMPLAINT Patient presents for Retina Follow Up (4 MO FU OU////Pt reports stable vision OU, no new f/f OU, no pain or pressure OU. \//Patient reports symptoms vastly improved, p"vastly improved old man no comparison ")   HISTORY OF PRESENT ILLNESS: Jake Clark is a 64 y.o. male who presents to the clinic today for:   HPI    Retina Follow Up    Patient presents with  Other.  In both eyes.  This started 4 months ago.  Duration of 4 months.  Since onset it is stable. Additional comments: 4 MO FU OU    Pt reports stable vision OU, no new f/f OU, no pain or pressure OU. \  Patient reports symptoms vastly improved, p"vastly improved old man no comparison "       Last edited by Edmon Crape, MD on 06/16/2020  9:26 AM. (History)      Referring physician: Jacqualine Mau, NP [address] [place],  [place] [address]  HISTORICAL INFORMATION:   Selected notes from the MEDICAL RECORD NUMBER       CURRENT MEDICATIONS: Current Outpatient Medications (Ophthalmic Drugs)  Medication Sig  . ofloxacin (OCUFLOX) 0.3 % ophthalmic solution Place 1 drop into the left eye 4 (four) times daily. (Patient not taking: Reported on 06/16/2020)  . prednisoLONE acetate (PRED FORTE) 1 % ophthalmic suspension Place 1 drop into the right eye 4 (four) times daily. (Patient not taking: Reported on 06/16/2020)  . prednisoLONE acetate (PRED FORTE) 1 % ophthalmic suspension Place 1 drop into the left eye 4 (four) times daily. (Patient not taking: Reported on 06/16/2020)   No current facility-administered medications for this visit. (Ophthalmic Drugs)   Current Outpatient Medications (Other)  Medication Sig  . amLODipine (NORVASC) 5 MG tablet Take 5 mg by mouth daily.  Marland Kitchen atorvastatin (LIPITOR) 20 MG tablet Take 5 mg by mouth daily.   . clobetasol cream (TEMOVATE) 0.05 % Apply 1 application topically 2 (two) times daily as needed. For rash. (not to face, groin, or  underarms)  . docusate sodium (COLACE) 100 MG capsule Take 100 mg by mouth 2 (two) times daily.  . hydrochlorothiazide (MICROZIDE) 12.5 MG capsule Take 12.5 mg by mouth daily.  . meloxicam (MOBIC) 7.5 MG tablet Take 7.5 mg by mouth 2 (two) times daily as needed for pain.  . methocarbamol (ROBAXIN) 500 MG tablet Take 1 tablet (500 mg total) by mouth every 6 (six) hours as needed for muscle spasms. (Patient not taking: Reported on 01/04/2017)  . oxyCODONE (OXY IR/ROXICODONE) 5 MG immediate release tablet Take 1-2 tablets (5-10 mg total) by mouth every 3 (three) hours as needed for moderate pain or severe pain. (Patient not taking: Reported on 01/04/2017)  . rivaroxaban (XARELTO) 10 MG TABS tablet Take 1 tablet (10 mg total) by mouth daily with breakfast. Take Xarelto for two and a half more weeks, then discontinue Xarelto. Once the patient has completed the Xarelto, they may resume the 81 mg Aspirin. (Patient not taking: Reported on 01/04/2017)  . traMADol (ULTRAM) 50 MG tablet Take 1-2 tablets (50-100 mg total) by mouth every 6 (six) hours as needed (mild pain). (Patient not taking: Reported on 01/04/2017)  . valsartan (DIOVAN) 160 MG tablet Take 160 mg by mouth daily.   No current facility-administered medications for this visit. (Other)      REVIEW OF SYSTEMS:    ALLERGIES Allergies  Allergen Reactions  . Latex  Other (See Comments)    Skin sensitivity   . Diclofenac Sodium Itching and Rash    PAST MEDICAL HISTORY Past Medical History:  Diagnosis Date  . Arthritis   . GERD (gastroesophageal reflux disease)   . History of hiatal hernia   . HTN (hypertension)   . Posterior vitreous detachment of left eye 01/19/2020  . Posterior vitreous detachment of right eye 12/10/2019  . 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 Vitreous floaters of right eye 12/10/2019   The nature of vitreous floaters was discussed with the patient as well as their frequent occurrence with development of  posterior vitreous detachment. The significance of flashes and field cuts was discussed with the patient. The need for a dilated fundus examination was discussed and advice given to return immediately for new or different floaters .  More uncommonly, vitreous floaters, debris, or  . Vitreous membranes and strands, right 01/01/2020   The nature of vitreous floaters was discussed with the patient as well as their frequent occurrence with development of posterior vitreous detachment. The significance of flashes and field cuts was discussed with the patient. The need for a dilated fundus examination was discussed and advice given to return immediately for new or different floaters .  More uncommonly, vitreous floaters, debris, or  . Vitreous membranes or strands, left 01/19/2020  . Wears glasses    Past Surgical History:  Procedure Laterality Date  . ANKLE FRACTURE SURGERY     left / 2005   . EYE SURGERY Right 01/28/2020   Vitrectomy, Dr. Luciana Axe  . KNEE SURGERY     scoped 1994/left   . TOTAL HIP ARTHROPLASTY Left 07/07/2015   Procedure: LEFT TOTAL HIP ARTHROPLASTY ANTERIOR APPROACH;  Surgeon: Ollen Gross, MD;  Location: [facility];  Service: Orthopedics;  Laterality: Left;  Marland Kitchen VASECTOMY      FAMILY HISTORY Family History  Problem Relation Age of Onset  . Heart disease Other     SOCIAL HISTORY Social History   Tobacco Use  . Smoking status: Never Smoker  . Smokeless tobacco: Never Used  Substance Use Topics  . Alcohol use: No  . Drug use: No         OPHTHALMIC EXAM:  Base Eye Exam    Visual Acuity (ETDRS)      Right Left   Dist [place] 20/30 20/25   Dist ph [place] 20/20 -1        Tonometry (Tonopen, 8:25 AM)      Right Left   Pressure 10 14       Pupils      Dark Light Shape React APD   Right 4 3 Round Brisk None   Left 5 4 Round Brisk None       Visual Fields (Counting fingers)      Left Right    Full Full       Extraocular Movement      Right Left    Full Full        Neuro/Psych    Oriented x3: Yes   Mood/Affect: Normal       Dilation    Both eyes: 1.0% Mydriacyl, 2.5% Phenylephrine @ 8:25 AM        Slit Lamp and Fundus Exam    External Exam      Right Left   External Normal Normal       Slit Lamp Exam      Right Left   Lids/Lashes Normal Normal   Conjunctiva/Sclera White  and quiet, clearing conjunctival hemorrhage White and quiet   Cornea Clear Clear   Anterior Chamber Deep and quiet Deep and quiet   Iris Round and reactive Round and reactive   Lens Posterior chamber intraocular lens Posterior chamber intraocular lens   Anterior Vitreous Normal Normal       Fundus Exam      Right Left   Posterior Vitreous Vitrectomized, clear Clear, vitrectomized   Disc Normal Normal   C/D Ratio 0.55 0.55   Macula Normal Normal   Vessels Normal Normal   Periphery Good retinopexy superiorly, scleral indentation visible, from buckle Normal, good retinopexy,  attached, no new retinal breaks.  Retina attached          IMAGING AND PROCEDURES  Imaging and Procedures for 06/16/20  OCT, Retina - OU - Both Eyes       Right Eye Quality was good. Scan locations included subfoveal. Central Foveal Thickness: 340. Findings include epiretinal membrane, abnormal foveal contour.   Left Eye Quality was good. Scan locations included subfoveal. Central Foveal Thickness: 308. Progression has been stable. Findings include normal foveal contour.   Notes OD: clear media, minor epiretinal membrane nasal portion of macula with no significant foveal distortion observe OS: vit strands                ASSESSMENT/PLAN:  Right epiretinal membrane Minor, no impact on acuity will observe  Posterior vitreous detachment of right eye Resolved post vitrectomy  Left epiretinal membrane Minor with no topographic distortion      ICD-10-CM   1. Vitreous membranes or strands, left  H43.312 OCT, Retina - OU - Both Eyes  2. Vitreous membranes and strands, right   H43.311 OCT, Retina - OU - Both Eyes  3. Right epiretinal membrane  H35.371   4. Posterior vitreous detachment of right eye  H43.811   5. Left epiretinal membrane  H35.372     1.  OU status post vitrectomy for visually symptomatic debilitating vitreous membranes and strands now with vastly improved symptoms OU,"' ol man there is no comparison"  2.  Follow-up as required, or symptoms develop or in 2 years to monitor epiretinal membrane  3.  Ophthalmic Meds Ordered this visit:  No orders of the defined types were placed in this encounter.      Return in about 2 years (around 06/16/2022) for DILATE OU, OCT.  There are no Patient Instructions on file for this visit.   Explained the diagnoses, plan, and follow up with the patient and they expressed understanding.  Patient expressed understanding of the importance of proper follow up care.   Alford Highland Tanasia Budzinski M.D. Diseases & Surgery of the Retina and Vitreous [facility] 06/16/20     Abbreviations: M myopia (nearsighted); A astigmatism; H hyperopia (farsighted); P presbyopia; Mrx spectacle prescription;  CTL contact lenses; OD right eye; OS left eye; OU both eyes  XT exotropia; ET esotropia; PEK punctate epithelial keratitis; PEE punctate epithelial erosions; DES dry eye syndrome; MGD meibomian gland dysfunction; ATs artificial tears; PFAT's preservative free artificial tears; NSC nuclear sclerotic cataract; PSC posterior subcapsular cataract; ERM epi-retinal membrane; PVD posterior vitreous detachment; RD retinal detachment; DM diabetes mellitus; DR diabetic retinopathy; NPDR non-proliferative diabetic retinopathy; PDR proliferative diabetic retinopathy; CSME clinically significant macular edema; DME diabetic macular edema; dbh dot blot hemorrhages; CWS cotton wool spot; POAG primary open angle glaucoma; C/D cup-to-disc ratio; HVF humphrey visual field; GVF goldmann visual field; OCT optical coherence tomography; IOP  intraocular  pressure; BRVO Branch retinal vein occlusion; CRVO central retinal vein occlusion; CRAO central retinal artery occlusion; BRAO branch retinal artery occlusion; RT retinal tear; SB scleral buckle; PPV pars plana vitrectomy; VH Vitreous hemorrhage; PRP panretinal laser photocoagulation; IVK intravitreal kenalog; VMT vitreomacular traction; MH Macular hole;  NVD neovascularization of the disc; NVE neovascularization elsewhere; AREDS age related eye disease study; ARMD age related macular degeneration; POAG primary open angle glaucoma; EBMD epithelial/anterior basement membrane dystrophy; ACIOL anterior chamber intraocular lens; IOL intraocular lens; PCIOL posterior chamber intraocular lens; Phaco/IOL phacoemulsification with intraocular lens placement; [place] photorefractive keratectomy; LASIK laser assisted in situ keratomileusis; HTN hypertension; DM diabetes mellitus; COPD chronic obstructive pulmonary disease

## 2020-06-16 NOTE — Assessment & Plan Note (Signed)
Minor with no topographic distortion

## 2020-11-12 ENCOUNTER — Other Ambulatory Visit: Payer: Self-pay

## 2020-11-12 ENCOUNTER — Other Ambulatory Visit (HOSPITAL_COMMUNITY)
Admission: RE | Admit: 2020-11-12 | Discharge: 2020-11-12 | Disposition: A | Discharge status: Home / Self Care | Payer: 59 | Source: Ambulatory Visit | Attending: Podiatry | Admitting: Podiatry

## 2020-11-12 DIAGNOSIS — B351 Tinea unguium: Secondary | ICD-10-CM | POA: Insufficient documentation

## 2020-11-12 LAB — HEPATIC FUNCTION PANEL
ALT: 17 U/L (ref 0–44)
AST: 19 U/L (ref 15–41)
Albumin: 4 g/dL (ref 3.5–5.0)
Alkaline Phosphatase: 86 U/L (ref 38–126)
Bilirubin, Direct: 0.2 mg/dL (ref 0.0–0.2)
Indirect Bilirubin: 0.7 mg/dL (ref 0.3–0.9)
Total Bilirubin: 0.9 mg/dL (ref 0.3–1.2)
Total Protein: 7.2 g/dL (ref 6.5–8.1)

## 2020-12-07 ENCOUNTER — Ambulatory Visit: Payer: 59 | Admitting: Cardiology

## 2020-12-07 ENCOUNTER — Encounter: Payer: Self-pay | Admitting: Cardiology

## 2020-12-07 ENCOUNTER — Other Ambulatory Visit: Payer: Self-pay

## 2020-12-07 VITALS — BP 127/77 | HR 77 | Temp 98.3°F | Resp 17 | Ht 73.0 in | Wt 253.8 lb

## 2020-12-07 DIAGNOSIS — E6609 Other obesity due to excess calories: Secondary | ICD-10-CM

## 2020-12-07 DIAGNOSIS — R42 Dizziness and giddiness: Secondary | ICD-10-CM

## 2020-12-07 DIAGNOSIS — R0989 Other specified symptoms and signs involving the circulatory and respiratory systems: Secondary | ICD-10-CM

## 2020-12-07 DIAGNOSIS — I10 Essential (primary) hypertension: Secondary | ICD-10-CM

## 2020-12-07 DIAGNOSIS — R55 Syncope and collapse: Secondary | ICD-10-CM

## 2020-12-07 NOTE — Progress Notes (Signed)
Date:  12/07/2020   ID:  Marda Stalker, DOB 04-12-1956, MRN [id number]  PCP:  Jacqualine Mau, NP  Cardiologist:  Tessa Lerner, DO, [facility] (established care 12/07/2020)  REASON FOR CONSULT: Lightheaded, dizziness, bradycardia, shortness of breath  REQUESTING PHYSICIAN:  Jacqualine Mau, NP [address] [place],  [place] [address]  Chief Complaint  Patient presents with   New Patient (Initial Visit)   Dizziness   Chest Pain    HPI  Jake Clark is a 65 y.o. male who presents to the office with a chief complaint of " lightheaded, dizzy." Patient's past medical history and cardiovascular risk factors include: Hypertension, prediabetes, obesity due to excess calories, arthritis.  He is referred to the office at the request of Marquita Palms B, NP for evaluation of Lightheaded, dizziness, bradycardia, shortness of breath.  Patient currently works as a Therapist, music and has been doing well and in normal state of health however over the last 1 year has been experiencing lightheaded and dizziness when he bends forward.  It is usually worse in hot weather but he is never passed out.  Approximately 2 weeks ago in mid May 2022 he was at work and he reached over to get up and off the floor and upon rising he started feeling lightheaded, dizzy, diaphoretic, and feeling really hot.  Patient sat down his coworkers checked his vitals and his pulse was 40 bpm and associated with shortness of breath.  He did not have any chest pain.  EMS was called the patient was not taken to urgent care or hospitalization for further evaluation.  He is now referred to cardiology for further assessment.  No prior history of syncopal events.  No family history of premature coronary artery disease or sudden cardiac death.  And no additional cardiac work-up performed up till now.  FUNCTIONAL STATUS: Patient is active with his day-to-day activities but no structured exercise program or daily  routine.  ALLERGIES: Allergies  Allergen Reactions   Latex Other (See Comments)    Skin sensitivity    Diclofenac Sodium Itching and Rash    MEDICATION LIST PRIOR TO VISIT: Current Meds  Medication Sig   amLODipine (NORVASC) 10 MG tablet Take 1 tablet by mouth daily.   clobetasol cream (TEMOVATE) 0.05 % Apply 1 application topically 2 (two) times daily as needed. For rash. (not to face, groin, or underarms)   hydrochlorothiazide (MICROZIDE) 12.5 MG capsule Take 25 mg by mouth daily.   losartan (COZAAR) 100 MG tablet Take 100 mg by mouth daily.   terbinafine (LAMISIL) 250 MG tablet Take 250 mg by mouth daily.     PAST MEDICAL HISTORY: Past Medical History:  Diagnosis Date   Arthritis    GERD (gastroesophageal reflux disease)    History of hiatal hernia    HTN (hypertension)    Posterior vitreous detachment of left eye 01/19/2020   Posterior vitreous detachment of right eye 12/10/2019   Vitreomacular adhesion of left eye 12/10/2019   Vitreous floaters of right eye 12/10/2019   The nature of vitreous floaters was discussed with the patient as well as their frequent occurrence with development of posterior vitreous detachment. The significance of flashes and field cuts was discussed with the patient. The need for a dilated fundus examination was discussed and advice given to return immediately for new or different floaters .  More uncommonly, vitreous floaters, debris, or   Vitreous membranes and strands, right 01/01/2020   The  nature of vitreous floaters was discussed with the patient as well as their frequent occurrence with development of posterior vitreous detachment. The significance of flashes and field cuts was discussed with the patient. The need for a dilated fundus examination was discussed and advice given to return immediately for new or different floaters .  More uncommonly, vitreous floaters, debris, or   Vitreous membranes or strands, left 01/19/2020   Wears glasses     PAST  SURGICAL HISTORY: Past Surgical History:  Procedure Laterality Date   ANKLE FRACTURE SURGERY     left / 2005    EYE SURGERY Right 01/28/2020   Vitrectomy, Dr. Luciana Axeankin   KNEE SURGERY     scoped 1994/left    TOTAL HIP ARTHROPLASTY Left 07/07/2015   Procedure: LEFT TOTAL HIP ARTHROPLASTY ANTERIOR APPROACH;  Surgeon: Ollen GrossFrank Aluisio, MD;  Location: [facility];  Service: Orthopedics;  Laterality: Left;   VASECTOMY      FAMILY HISTORY: The patient family history includes Atrial fibrillation in his mother; Dementia in his father; Heart disease in an other family member.  SOCIAL HISTORY:  The patient  reports that he has never smoked. He has never used smokeless tobacco. He reports that he does not drink alcohol and does not use drugs.  REVIEW OF SYSTEMS: Review of Systems  Constitutional: Negative for chills and fever.  HENT:  Negative for hoarse voice and nosebleeds.   Eyes:  Negative for discharge, double vision and pain.  Cardiovascular:  Positive for near-syncope. Negative for chest pain, claudication, dyspnea on exertion, leg swelling, orthopnea, palpitations, paroxysmal nocturnal dyspnea and syncope.  Respiratory:  Positive for shortness of breath. Negative for hemoptysis.   Musculoskeletal:  Negative for muscle cramps and myalgias.  Gastrointestinal:  Negative for abdominal pain, constipation, diarrhea, hematemesis, hematochezia, melena, nausea and vomiting.  Neurological:  Positive for dizziness and light-headedness.   PHYSICAL EXAM: Vitals with BMI 12/07/2020 03/06/2019 07/08/2015  Height 6\' 1"  6\' 1"  -  Weight 253 lbs 13 oz 275 lbs -  BMI 33.49 36.29 -  Systolic 127 132 [id number]  Diastolic 77 92 55  Pulse 77 81 79   Orthostatic VS for the past 72 hrs (Last 3 readings):  Orthostatic BP Patient Position BP Location Cuff Size Orthostatic Pulse  12/07/20 1030 119/73 Standing Left Arm Large 77  12/07/20 1029 124/74 Sitting Left Arm Large 71  12/07/20 1028 125/72 Supine Left Arm Large 69    CONSTITUTIONAL: Well-developed and well-nourished. No acute distress.  SKIN: Skin is warm and dry. No rash noted. No cyanosis. No pallor. No jaundice HEAD: Normocephalic and atraumatic.  EYES: No scleral icterus MOUTH/THROAT: Moist oral membranes.  NECK: No JVD present. No thyromegaly noted.  Left carotid bruits  LYMPHATIC: No visible cervical adenopathy.  CHEST Normal respiratory effort. No intercostal retractions  LUNGS: Clear to auscultation bilaterally.  No stridor. No wheezes. No rales.  CARDIOVASCULAR: Regular, positive S1-S2, no murmurs rubs or gallops appreciated ABDOMINAL: Obese, soft, nontender, nondistended, positive bowel sounds in all 4 quadrants, no apparent ascites.  EXTREMITIES: No peripheral edema  HEMATOLOGIC: No significant bruising NEUROLOGIC: Oriented to person, place, and time. Nonfocal. Normal muscle tone.  PSYCHIATRIC: Normal mood and affect. Normal behavior. Cooperative  CARDIAC DATABASE: EKG: 12/07/2020: Sinus  Rhythm, 68bpm, without underlying injury pattern.   Echocardiogram: No results found for this or any previous visit from the past 1095 days.   Stress Testing: No results found for this or any previous visit from the past 1095 days.  Heart Catheterization: Performed  in 1995-no records available.  No interventions were performed per patient.  LABORATORY DATA: CBC Latest Ref Rng & Units 07/08/2015 07/01/2015  WBC 4.0 - 10.5 K/uL 11.8(H) 7.1  Hemoglobin 13.0 - 17.0 g/dL 79.0 24.0  Hematocrit 97.3 - 52.0 % 39.3 44.5  Platelets 150 - 400 K/uL 251 273    CMP Latest Ref Rng & Units 11/12/2020 07/08/2015 07/01/2015  Glucose 65 - 99 mg/dL - 532(D) 924(Q)  BUN 6 - 20 mg/dL - 14 11  Creatinine 6.83 - 1.24 mg/dL - 4.19 6.22  Sodium 297 - 145 mmol/L - 139 141  Potassium 3.5 - 5.1 mmol/L - 4.2 3.8  Chloride 101 - 111 mmol/L - 107 105  CO2 22 - 32 mmol/L - 26 28  Calcium 8.9 - 10.3 mg/dL - 8.9 9.5  Total Protein 6.5 - 8.1 g/dL 7.2 - 7.2  Total Bilirubin 0.3  - 1.2 mg/dL 0.9 - 0.9  Alkaline Phos 38 - 126 U/L 86 - 116  AST 15 - 41 U/L 19 - 19  ALT 0 - 44 U/L 17 - 21    Lipid Panel  No results found for: CHOL, TRIG, HDL, CHOLHDL, VLDL, LDLCALC, LDLDIRECT, LABVLDL  No components found for: NTPROBNP No results for input(s): PROBNP in the last 8760 hours. No results for input(s): TSH in the last 8760 hours.  BMP No results for input(s): NA, K, CL, CO2, GLUCOSE, BUN, CREATININE, CALCIUM, GFRNONAA, GFRAA in the last 8760 hours.  HEMOGLOBIN A1C No results found for: HGBA1C, MPG  Lipid profile: Collected 07/23/2020: Total cholesterol 181, triglycerides 54, HDL 50, LDL 121  IMPRESSION:    ICD-10-CM   1. Lightheadedness  R42     2. Dizzinesses  R42     3. Near syncope  R55     4. Left carotid bruit  R09.89 PCV CAROTID DUPLEX (BILATERAL)    5. Benign hypertension  I10 EKG 12-Lead    PCV ECHOCARDIOGRAM COMPLETE    6. Class 1 obesity due to excess calories with serious comorbidity and body mass index (BMI) of 33.0 to 33.9 in adult  E66.09    Z68.33        RECOMMENDATIONS: HOSEY BURMESTER is a 65 y.o. male whose past medical history and cardiac risk factors include: Hypertension, prediabetes, obesity due to excess calories, arthritis.  Lightheaded and dizziness: Patient has been experiencing lightheaded and dizziness when he bends forward and then gets up in a vertical/erect position.  The symptoms are exacerbated during hot weather and dehydration. Patient takes all of his medications in the morning and usually experiences midday or in the evening hours. I have asked him to take the hydrochlorothiazide and losartan in the morning and amlodipine at night.   It is to discontinue recommend holding diuretic therapy. Recommended the importance of staying hydrated and even using compression stockings. Orthostatic vital signs negative. He is asked to seek medical attention by going to the closest ER via EMS if he has a syncopal  event. Recommend an echocardiogram to evaluate for structural heart disease.  Benign essential hypertension: Patient takes all of his medications in the morning which may be precipitating these episodes of lightheaded and dizziness. Medication changes as noted above Echocardiogram will be ordered to evaluate for structural heart disease and left ventricular systolic function.  Left carotid bruit: Check carotid duplex  Obesity, due to excess calories: Body mass index is 33.48 kg/m. I reviewed with the patient the importance of diet, regular physical activity/exercise, weight loss.  Patient is educated on increasing physical activity gradually as tolerated.  With the goal of moderate intensity exercise for 30 minutes a day 5 days a week.   His estimated 10-year risk of ASCVD is approximately 10%.  We discussed considering ischemic evaluation given his risk factors and symptoms by doing a GXT.  However the shared decision was to proceed with the echocardiogram and carotid duplex for now and if he continues to have symptoms patient is willing to proceed with stress testing.  Recommend close follow-up with PCP for management of his other chronic comorbid conditions.  FINAL MEDICATION LIST END OF ENCOUNTER: No orders of the defined types were placed in this encounter.    Current Outpatient Medications:    amLODipine (NORVASC) 10 MG tablet, Take 1 tablet by mouth daily., Disp: , Rfl:    clobetasol cream (TEMOVATE) 0.05 %, Apply 1 application topically 2 (two) times daily as needed. For rash. (not to face, groin, or underarms), Disp: , Rfl: 3   hydrochlorothiazide (MICROZIDE) 12.5 MG capsule, Take 25 mg by mouth daily., Disp: , Rfl: 5   losartan (COZAAR) 100 MG tablet, Take 100 mg by mouth daily., Disp: , Rfl:    terbinafine (LAMISIL) 250 MG tablet, Take 250 mg by mouth daily., Disp: , Rfl:   Orders Placed This Encounter  Procedures   EKG 12-Lead   PCV ECHOCARDIOGRAM COMPLETE   PCV  CAROTID DUPLEX (BILATERAL)    There are no Patient Instructions on file for this visit.   --Continue cardiac medications as reconciled in final medication list. --Return in about 5 weeks (around 01/11/2021) for Follow up lightheaded/dizziness/near syncope, review test results. Or sooner if needed. --Continue follow-up with your primary care physician regarding the management of your other chronic comorbid conditions.  Patient's questions and concerns were addressed to his satisfaction. He voices understanding of the instructions provided during this encounter.   This note was created using a voice recognition software as a result there may be grammatical errors inadvertently enclosed that do not reflect the nature of this encounter. Every attempt is made to correct such errors.  Tessa Lerner, [place], [facility]  Pager: [phone] Office: [phone]

## 2020-12-09 ENCOUNTER — Other Ambulatory Visit: Payer: Self-pay

## 2020-12-09 DIAGNOSIS — R0989 Other specified symptoms and signs involving the circulatory and respiratory systems: Secondary | ICD-10-CM

## 2020-12-09 DIAGNOSIS — I10 Essential (primary) hypertension: Secondary | ICD-10-CM

## 2021-01-04 ENCOUNTER — Ambulatory Visit: Payer: 59

## 2021-01-04 ENCOUNTER — Other Ambulatory Visit: Payer: Self-pay

## 2021-01-13 ENCOUNTER — Other Ambulatory Visit: Payer: Self-pay

## 2021-01-13 ENCOUNTER — Encounter: Payer: Self-pay | Admitting: Cardiology

## 2021-01-13 ENCOUNTER — Ambulatory Visit: Payer: 59 | Admitting: Cardiology

## 2021-01-13 VITALS — BP 147/79 | HR 82 | Temp 97.9°F | Resp 16 | Ht 73.0 in | Wt 253.0 lb

## 2021-01-13 DIAGNOSIS — R42 Dizziness and giddiness: Secondary | ICD-10-CM

## 2021-01-13 DIAGNOSIS — I10 Essential (primary) hypertension: Secondary | ICD-10-CM

## 2021-01-13 DIAGNOSIS — I6521 Occlusion and stenosis of right carotid artery: Secondary | ICD-10-CM

## 2021-01-13 DIAGNOSIS — E782 Mixed hyperlipidemia: Secondary | ICD-10-CM

## 2021-01-13 DIAGNOSIS — R55 Syncope and collapse: Secondary | ICD-10-CM

## 2021-01-13 DIAGNOSIS — E6609 Other obesity due to excess calories: Secondary | ICD-10-CM

## 2021-01-13 MED ORDER — ROSUVASTATIN CALCIUM 5 MG PO TABS: 5.0000 mg | ORAL_TABLET | Freq: Every day | ORAL | 0 refills | Status: DC

## 2021-01-13 MED ORDER — ASPIRIN EC 81 MG PO TBEC: 81.0000 mg | DELAYED_RELEASE_TABLET | Freq: Every day | ORAL | 11 refills | Status: DC

## 2021-01-13 NOTE — Progress Notes (Signed)
Date:  01/13/2021   ID:  Jake Clark, DOB [birth year], MRN [id number]  PCP:  Teressa Senter, FNP  Cardiologist:  Rex Kras, DO, [place] (established care 12/07/2020)  Date: 01/13/21 Last Office Visit: 12/07/2020  Chief Complaint  Patient presents with   Dizziness   Follow-up   Results    HPI  Jake Clark is a 65 y.o. male who presents to the office with a chief complaint of " 1 month follow-up reevaluation of dizziness and review test results." Patient's past medical history and cardiovascular risk factors include: Asymptomatic carotid artery stenosis, hypertension, prediabetes, obesity due to excess calories, arthritis.  He is referred to the office at the request of Mort Sawyers B, NP for evaluation of Lightheaded, dizziness, bradycardia, shortness of breath.  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  Please refer to the consultation note for additional details.  At the last office visit recommended that he splits his antihypertensive medications so somewhat taken in the morning and some in the evening to help prevent episodes of lightheaded and dizziness.  Given his carotid bruit on physical examination he was recommended to have a carotid duplex which notes asymptomatic carotid artery stenosis less than 50%.  Results reviewed with him in great detail and noted below for further reference.  Clinically patient states that he no longer is having episodes of lightheadedness, dizziness, near-syncope or syncope.  His shortness of breath has completely resolved.  And his home blood pressures are around 130/80 mmHg.  No family history of premature coronary artery disease or sudden cardiac death.  FUNCTIONAL STATUS: Patient is active with his day-to-day activities but no structured exercise program or daily routine.  ALLERGIES: Allergies  Allergen Reactions   Latex  Other (See Comments)    Skin sensitivity    Diclofenac Sodium Itching and Rash    MEDICATION LIST PRIOR TO VISIT: Current Meds  Medication Sig   amLODipine (NORVASC) 10 MG tablet Take 1 tablet by mouth daily.   aspirin EC 81 MG tablet Take 1 tablet (81 mg total) by mouth daily. Swallow whole.   clobetasol cream (TEMOVATE) 6.37 % Apply 1 application topically 2 (two) times daily as needed. For rash. (not to face, groin, or underarms)   hydrochlorothiazide (MICROZIDE) 12.5 MG capsule Take 25 mg by mouth daily.   losartan (COZAAR) 100 MG tablet Take 100 mg by mouth daily.   rosuvastatin (CRESTOR) 5 MG tablet Take 1 tablet (5 mg total) by mouth at bedtime.   terbinafine (LAMISIL) 250 MG tablet Take 250 mg by mouth daily.     PAST MEDICAL HISTORY: Past Medical History:  Diagnosis Date   Arthritis    GERD (gastroesophageal reflux disease)    History of hiatal hernia    HTN (hypertension)    Posterior vitreous detachment of left eye 01/19/2020   Posterior vitreous detachment of right eye 12/10/2019   Vitreomacular adhesion of left eye 12/10/2019   Vitreous floaters of right eye 12/10/2019   The nature of vitreous floaters was discussed with the patient as well as their frequent occurrence with development of posterior vitreous detachment. The significance of flashes and field cuts was discussed with the patient. The need for a dilated fundus examination was discussed and advice given to return immediately for new or different floaters .  More uncommonly, vitreous floaters, debris, or   Vitreous membranes and strands, right 01/01/2020   The nature  of vitreous floaters was discussed with the patient as well as their frequent occurrence with development of posterior vitreous detachment. The significance of flashes and field cuts was discussed with the patient. The need for a dilated fundus examination was discussed and advice given to return immediately for new or different floaters .  More uncommonly,  vitreous floaters, debris, or   Vitreous membranes or strands, left 01/19/2020   Wears glasses     PAST SURGICAL HISTORY: Past Surgical History:  Procedure Laterality Date   ANKLE FRACTURE SURGERY     left / 2005    EYE SURGERY Right 01/28/2020   Vitrectomy, Dr. Zadie Rhine   KNEE SURGERY     scoped 1994/left    TOTAL HIP ARTHROPLASTY Left 07/07/2015   Procedure: LEFT TOTAL HIP ARTHROPLASTY ANTERIOR APPROACH;  Surgeon: Gaynelle Arabian, MD;  Location: [facility];  Service: Orthopedics;  Laterality: Left;   VASECTOMY      FAMILY HISTORY: The patient family history includes Atrial fibrillation in his mother; Dementia in his father; Heart disease in an other family member.  SOCIAL HISTORY:  The patient  reports that he has never smoked. He has never used smokeless tobacco. He reports that he does not drink alcohol and does not use drugs.  REVIEW OF SYSTEMS: Review of Systems  Constitutional: Negative for chills and fever.  HENT:  Negative for hoarse voice and nosebleeds.   Eyes:  Negative for discharge, double vision and pain.  Cardiovascular:  Negative for chest pain, claudication, dyspnea on exertion, leg swelling, near-syncope, orthopnea, palpitations, paroxysmal nocturnal dyspnea and syncope.  Respiratory:  Negative for hemoptysis and shortness of breath.   Musculoskeletal:  Negative for muscle cramps and myalgias.  Gastrointestinal:  Negative for abdominal pain, constipation, diarrhea, hematemesis, hematochezia, melena, nausea and vomiting.  Neurological:  Negative for dizziness and light-headedness.   PHYSICAL EXAM: Vitals with BMI 01/13/2021 12/07/2020 03/06/2019  Height 6' 1"  6' 1"  6' 1"   Weight 253 lbs 253 lbs 13 oz 275 lbs  BMI 33.39 54.65 03.54  Systolic 656 812 751  Diastolic 79 77 92  Pulse 82 77 81   CONSTITUTIONAL: Well-developed and well-nourished. No acute distress.  SKIN: Skin is warm and dry. No rash noted. No cyanosis. No pallor. No jaundice HEAD: Normocephalic and  atraumatic.  EYES: No scleral icterus MOUTH/THROAT: Moist oral membranes.  NECK: No JVD present. No thyromegaly noted.  Right carotid bruits  LYMPHATIC: No visible cervical adenopathy.  CHEST Normal respiratory effort. No intercostal retractions  LUNGS: Clear to auscultation bilaterally.  No stridor. No wheezes. No rales.  CARDIOVASCULAR: Regular, positive S1-S2, no murmurs rubs or gallops appreciated ABDOMINAL: Obese, soft, nontender, nondistended, positive bowel sounds in all 4 quadrants, no apparent ascites.  EXTREMITIES: No peripheral edema  HEMATOLOGIC: No significant bruising NEUROLOGIC: Oriented to person, place, and time. Nonfocal. Normal muscle tone.  PSYCHIATRIC: Normal mood and affect. Normal behavior. Cooperative  CARDIAC DATABASE: EKG: 12/07/2020: Sinus  Rhythm, 68bpm, without underlying injury pattern.   Echocardiogram: 01/04/2021: Left ventricle cavity is normal in size and wall thickness. Normal global wall motion. Normal LV systolic function with EF 55%. Doppler evidence of grade I (impaired) diastolic dysfunction, normal LAP. Left atrial cavity is mildly dilated. Trileaflet aortic valve with no regurgitation. Mild tricuspid regurgitation. Estimated pulmonary artery systolic pressure 35 mmHg.   Stress Testing: No results found for this or any previous visit from the past 1095 days.  Heart Catheterization: Performed in 1995-no records available.  No interventions were performed per patient.  Carotid artery duplex 01/04/2021:  Duplex suggests stenosis in the right internal carotid artery (16-49%).  Duplex suggests stenosis in the right external carotid artery (<50%).  No evidence of significant stenosis in the left carotid vessels.  Antegrade right vertebral artery flow. Antegrade left vertebral artery flow.   Follow up in one year is appropriate if clinically indicated.  LABORATORY DATA: CBC Latest Ref Rng & Units 07/08/2015 07/01/2015  WBC 4.0 - 10.5 K/uL 11.8(H)  7.1  Hemoglobin 13.0 - 17.0 g/dL 13.2 14.7  Hematocrit 39.0 - 52.0 % 39.3 44.5  Platelets 150 - 400 K/uL 251 273    CMP Latest Ref Rng & Units 11/12/2020 07/08/2015 07/01/2015  Glucose 65 - 99 mg/dL - 134(H) 113(H)  BUN 6 - 20 mg/dL - 14 11  Creatinine 0.61 - 1.24 mg/dL - 0.62 0.64  Sodium 135 - 145 mmol/L - 139 141  Potassium 3.5 - 5.1 mmol/L - 4.2 3.8  Chloride 101 - 111 mmol/L - 107 105  CO2 22 - 32 mmol/L - 26 28  Calcium 8.9 - 10.3 mg/dL - 8.9 9.5  Total Protein 6.5 - 8.1 g/dL 7.2 - 7.2  Total Bilirubin 0.3 - 1.2 mg/dL 0.9 - 0.9  Alkaline Phos 38 - 126 U/L 86 - 116  AST 15 - 41 U/L 19 - 19  ALT 0 - 44 U/L 17 - 21    Lipid Panel  No results found for: CHOL, TRIG, HDL, CHOLHDL, VLDL, LDLCALC, LDLDIRECT, LABVLDL  No components found for: NTPROBNP No results for input(s): PROBNP in the last 8760 hours. No results for input(s): TSH in the last 8760 hours.  BMP No results for input(s): NA, K, CL, CO2, GLUCOSE, BUN, CREATININE, CALCIUM, GFRNONAA, GFRAA in the last 8760 hours.  HEMOGLOBIN A1C No results found for: HGBA1C, MPG  Lipid profile: Collected 07/23/2020: Total cholesterol 181, triglycerides 54, HDL 50, LDL 121  IMPRESSION:    ICD-10-CM   1. Carotid artery stenosis, asymptomatic, right  I65.21 aspirin EC 81 MG tablet    rosuvastatin (CRESTOR) 5 MG tablet    Lipid Panel With LDL/HDL Ratio    LDL cholesterol, direct    CMP14+EGFR    PCV MYOCARDIAL PERFUSION WO LEXISCAN    PCV CAROTID DUPLEX (BILATERAL)    2. Mixed hyperlipidemia  E78.2 rosuvastatin (CRESTOR) 5 MG tablet    Lipid Panel With LDL/HDL Ratio    LDL cholesterol, direct    CMP14+EGFR    3. Benign hypertension  I10     4. Class 1 obesity due to excess calories with serious comorbidity and body mass index (BMI) of 33.0 to 33.9 in adult  E66.09    Z68.33        RECOMMENDATIONS: ARLINGTON SIGMUND is a 65 y.o. male whose past medical history and cardiac risk factors include: Hypertension, prediabetes,  obesity due to excess calories, arthritis.  Asymptomatic carotid artery stenosis: Patient is estimated 10-year risk of ASCVD is greater than 10% and given the findings of carotid artery stenosis and last lipid profile recommend aspirin 81 mg p.o. daily and rosuvastatin 5 mg p.o. nightly. Will check a carotid duplex in 1 year for follow-up  Mixed hyperlipidemia: Shared decision at today's office visit was to start rosuvastatin 5 mg p.o. nightly. Patient informs me that he used to be on statin therapy but was later discontinued as his lipid profile was reported to be favorable. However, given his ASCVD risk score and asymptomatic carotid artery stenosis he is not willing to restart statin  therapy. Check fasting lipid profile and CMP in 6 weeks to evaluate lipids and liver function.  Benign essential hypertension: Recommend a goal systolic blood pressure of 120/80 if able to tolerate. Low-salt diet recommended Medications reconciled.  Given his estimated 10-year risk of ASCVD to be greater than 10%, asymptomatic carotid artery stenosis, the shared decision was to proceed with exercise nuclear stress test to evaluate for reversible ischemia.  FINAL MEDICATION LIST END OF ENCOUNTER: Meds ordered this encounter  Medications   aspirin EC 81 MG tablet    Sig: Take 1 tablet (81 mg total) by mouth daily. Swallow whole.    Dispense:  30 tablet    Refill:  11   rosuvastatin (CRESTOR) 5 MG tablet    Sig: Take 1 tablet (5 mg total) by mouth at bedtime.    Dispense:  90 tablet    Refill:  0      Current Outpatient Medications:    amLODipine (NORVASC) 10 MG tablet, Take 1 tablet by mouth daily., Disp: , Rfl:    aspirin EC 81 MG tablet, Take 1 tablet (81 mg total) by mouth daily. Swallow whole., Disp: 30 tablet, Rfl: 11   clobetasol cream (TEMOVATE) 2.69 %, Apply 1 application topically 2 (two) times daily as needed. For rash. (not to face, groin, or underarms), Disp: , Rfl: 3    hydrochlorothiazide (MICROZIDE) 12.5 MG capsule, Take 25 mg by mouth daily., Disp: , Rfl: 5   losartan (COZAAR) 100 MG tablet, Take 100 mg by mouth daily., Disp: , Rfl:    rosuvastatin (CRESTOR) 5 MG tablet, Take 1 tablet (5 mg total) by mouth at bedtime., Disp: 90 tablet, Rfl: 0   terbinafine (LAMISIL) 250 MG tablet, Take 250 mg by mouth daily., Disp: , Rfl:   Orders Placed This Encounter  Procedures   Lipid Panel With LDL/HDL Ratio   LDL cholesterol, direct   CMP14+EGFR   PCV MYOCARDIAL PERFUSION WO LEXISCAN   PCV CAROTID DUPLEX (BILATERAL)    There are no Patient Instructions on file for this visit.   --Continue cardiac medications as reconciled in final medication list. --Return in about 7 weeks (around 03/03/2021). Or sooner if needed. --Continue follow-up with your primary care physician regarding the management of your other chronic comorbid conditions.  Patient's questions and concerns were addressed to his satisfaction. He voices understanding of the instructions provided during this encounter.   This note was created using a voice recognition software as a result there may be grammatical errors inadvertently enclosed that do not reflect the nature of this encounter. Every attempt is made to correct such errors.  Rex Kras, [place], [facility]  Pager: [phone] Office: [phone]

## 2021-01-17 ENCOUNTER — Other Ambulatory Visit: Payer: Self-pay

## 2021-01-17 DIAGNOSIS — I6521 Occlusion and stenosis of right carotid artery: Secondary | ICD-10-CM

## 2021-01-17 DIAGNOSIS — E782 Mixed hyperlipidemia: Secondary | ICD-10-CM

## 2021-02-02 ENCOUNTER — Other Ambulatory Visit: Payer: Self-pay

## 2021-02-02 ENCOUNTER — Ambulatory Visit: Payer: 59

## 2021-02-02 DIAGNOSIS — I6521 Occlusion and stenosis of right carotid artery: Secondary | ICD-10-CM

## 2021-02-05 ENCOUNTER — Other Ambulatory Visit: Payer: Self-pay | Admitting: Cardiology

## 2021-02-05 DIAGNOSIS — E782 Mixed hyperlipidemia: Secondary | ICD-10-CM

## 2021-02-05 DIAGNOSIS — I6521 Occlusion and stenosis of right carotid artery: Secondary | ICD-10-CM

## 2021-03-03 ENCOUNTER — Ambulatory Visit: Payer: 59 | Admitting: Cardiology

## 2021-07-01 ENCOUNTER — Other Ambulatory Visit: Payer: Self-pay | Admitting: Cardiology

## 2021-07-01 DIAGNOSIS — E782 Mixed hyperlipidemia: Secondary | ICD-10-CM

## 2021-07-01 DIAGNOSIS — I6521 Occlusion and stenosis of right carotid artery: Secondary | ICD-10-CM

## 2021-09-29 ENCOUNTER — Other Ambulatory Visit: Payer: Self-pay | Admitting: Cardiology

## 2021-09-29 DIAGNOSIS — E782 Mixed hyperlipidemia: Secondary | ICD-10-CM

## 2021-09-29 DIAGNOSIS — I6521 Occlusion and stenosis of right carotid artery: Secondary | ICD-10-CM

## 2021-12-04 ENCOUNTER — Other Ambulatory Visit: Payer: Self-pay | Admitting: Cardiology

## 2021-12-04 DIAGNOSIS — I6521 Occlusion and stenosis of right carotid artery: Secondary | ICD-10-CM

## 2021-12-06 ENCOUNTER — Encounter (INDEPENDENT_AMBULATORY_CARE_PROVIDER_SITE_OTHER): Payer: Self-pay | Admitting: Ophthalmology

## 2021-12-06 ENCOUNTER — Ambulatory Visit (INDEPENDENT_AMBULATORY_CARE_PROVIDER_SITE_OTHER): Payer: BC Managed Care – PPO | Admitting: Ophthalmology

## 2021-12-06 DIAGNOSIS — H35372 Puckering of macula, left eye: Secondary | ICD-10-CM

## 2021-12-06 DIAGNOSIS — H348312 Tributary (branch) retinal vein occlusion, right eye, stable: Secondary | ICD-10-CM | POA: Diagnosis not present

## 2021-12-06 DIAGNOSIS — H43312 Vitreous membranes and strands, left eye: Secondary | ICD-10-CM | POA: Diagnosis not present

## 2021-12-06 DIAGNOSIS — H35072 Retinal telangiectasis, left eye: Secondary | ICD-10-CM | POA: Diagnosis not present

## 2021-12-06 MED ORDER — FLUORESCEIN SODIUM 10 % IV SOLN
500.0000 mg | INTRAVENOUS | Status: AC | PRN
  Administered 2021-12-06: 500 mg via INTRAVENOUS

## 2021-12-06 NOTE — Patient Instructions (Signed)
Recommend patient arrange home sleep study and report to his PCP that he has symptoms of sleep apnea and has been reported to snore

## 2021-12-06 NOTE — Assessment & Plan Note (Addendum)
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  This may be consistent with the patient's recent symptomatology in the left eye  The patient in fact does have a positive view of systems for sleep apnea.  I have urged the patient promptly to seek medical evaluation and consideration for sleep testing potentially at home sleep testing and if found to have sleep apnea to undertake all measures to consider therapy so as to protect his medical health, to decrease his risk of stroke and heart attack, and in my opinion and experience to stop the presence and development and worsening of MAC-TEL and alleviate symptomatology thereof  Macular telangiectasis (MAC-TEL), or parafoveal telangiectasis is a condition of "unknown" cause.  Findings in or near the macula (center of vision) consist of microaneurysms (leaking small capillaries), often with leakage of fluid which in the active phase can impact fine discriminatory vision, and in some cases trigger profound scarring in the macula, with severe permanent vision loss.  Standard treatment is observation and periodic examinations to monitor for treatable complications.   The cause  of this condition is "unknown".  However, the practice of Dr. Zadie Rhine has discovered an association with sleep apnea with its nightly periods of low oxygen in the blood stream (hypoxia), retained carbon dioxide (hypercapnia), associated with transient nocturnal hypertensive episodes.   More recently, some patients also been found to have advanced lung disease, whether asthma or COPD, with similar findings.  Dr. Zadie Rhine has been evaluating the association of sleep apnea, nightly hypoxia, and Macular telangiectasis for over 18 years.  Most patients are found to be  noncompliant with sleep apnea therapy or testing in the past.  Resumption of CPAP or similar therapy is strongly recommended if ordered in the past.  Upon review of risk factors or findings positive for sleep apnea, more formal, extensive sleep laboratory or home testing, may be recommended.  Numerous patients, proven to have MAC-TEL, have improved or resolved their ey condition promptly, within weeks, of the use of nighttime oxygen supplementation or continuous positive airway pressure (CPAP).

## 2021-12-06 NOTE — Assessment & Plan Note (Signed)
Minor no impact on acuity 

## 2021-12-06 NOTE — Progress Notes (Signed)
12/06/2021     CHIEF COMPLAINT Patient presents for  Chief Complaint  Patient presents with   Eye Problem      HISTORY OF PRESENT ILLNESS: Jake Clark is a 66 y.o. male who presents to the clinic today for:   HPI   WIP- Increase in floaters. Last seen 1 yr ago. Patient reports "I have a lot of floaters in the back of my eyes. I saw pepper flakes 6 months ago, I could live with that. Since then it is worse and worse. The floaters now are bigger, the left eye is worse, it seems like there is a steady film over my eye. I notice a couple floaters in my right eye but they are not too bad." Patient reports he was seen by Dr. Jorja Loa, OD last Monday. He reports "he said it looks like it is covering my entire eye, he can see what is in my eye."  OS, with great type vision actually which cannot be blink away which she does not come and go been present for about 3 months.  Prior to that had some speckles of white dots in his left eye  which has in the past undergone vitrectomy for vitreous strands And membranes  Last edited by Hurman Horn, MD on 12/06/2021  4:16 PM.      Referring physician: Madelin Headings, DO [address] [place],  [place] [address]  HISTORICAL INFORMATION:   Selected notes from the [facility]: No current outpatient medications on file. (Ophthalmic Drugs)   No current facility-administered medications for this visit. (Ophthalmic Drugs)   Current Outpatient Medications (Other)  Medication Sig   amLODipine (NORVASC) 10 MG tablet Take 1 tablet by mouth daily.   clobetasol cream (TEMOVATE) 8.24 % Apply 1 application topically 2 (two) times daily as needed. For rash. (not to face, groin, or underarms)   [facility]S ASPIRIN LOW DOSE 81 MG tablet TAKE 1 TABLET (81 MG TOTAL) BY MOUTH DAILY. SWALLOW WHOLE.   hydrochlorothiazide (MICROZIDE) 12.5 MG capsule Take 25 mg by mouth daily.   losartan (COZAAR) 100 MG tablet Take 100 mg by mouth  daily.   rosuvastatin (CRESTOR) 5 MG tablet TAKE 1 TABLET BY MOUTH EVERYDAY AT BEDTIME   terbinafine (LAMISIL) 250 MG tablet Take 250 mg by mouth daily.   No current facility-administered medications for this visit. (Other)      REVIEW OF SYSTEMS: ROS   Negative for: Constitutional, Gastrointestinal, Neurological, Skin, Genitourinary, Musculoskeletal, HENT, Endocrine, Cardiovascular, Eyes, Respiratory, Psychiatric, Allergic/Imm, Heme/Lymph Last edited by Hurman Horn, MD on 12/06/2021  4:16 PM.       ALLERGIES Allergies  Allergen Reactions   Latex Other (See Comments)    Skin sensitivity    Diclofenac Sodium Itching and Rash    PAST MEDICAL HISTORY Past Medical History:  Diagnosis Date   Arthritis    GERD (gastroesophageal reflux disease)    History of hiatal hernia    HTN (hypertension)    Posterior vitreous detachment of left eye 01/19/2020   Posterior vitreous detachment of right eye 12/10/2019   Vitreomacular adhesion of left eye 12/10/2019   Vitreous floaters of right eye 12/10/2019   The nature of vitreous floaters was discussed with the patient as well as their frequent occurrence with development of posterior vitreous detachment. The significance of flashes and field cuts was discussed with the patient. The need for a dilated fundus examination was discussed and advice  given to return immediately for new or different floaters .  More uncommonly, vitreous floaters, debris, or   Vitreous membranes and strands, right 01/01/2020   The nature of vitreous floaters was discussed with the patient as well as their frequent occurrence with development of posterior vitreous detachment. The significance of flashes and field cuts was discussed with the patient. The need for a dilated fundus examination was discussed and advice given to return immediately for new or different floaters .  More uncommonly, vitreous floaters, debris, or   Vitreous membranes or strands, left 01/19/2020    Wears glasses    Past Surgical History:  Procedure Laterality Date   ANKLE FRACTURE SURGERY     left / 2005    EYE SURGERY Right 01/28/2020   Vitrectomy, Dr. Zadie Rhine   KNEE SURGERY     scoped 1994/left    TOTAL HIP ARTHROPLASTY Left 07/07/2015   Procedure: LEFT TOTAL HIP ARTHROPLASTY ANTERIOR APPROACH;  Surgeon: Gaynelle Arabian, MD;  Location: [facility];  Service: Orthopedics;  Laterality: Left;   VASECTOMY      FAMILY HISTORY Family History  Problem Relation Age of Onset   Atrial fibrillation Mother    Dementia Father    Heart disease Other     SOCIAL HISTORY Social History   Tobacco Use   Smoking status: Never   Smokeless tobacco: Never  Substance Use Topics   Alcohol use: No   Drug use: No         OPHTHALMIC EXAM:  Base Eye Exam     Visual Acuity (ETDRS)       Right Left   Dist [place] 20/20 20/30 -1   Dist ph   20/20 -2         Tonometry (Tonopen, 3:19 PM)       Right Left   Pressure 14 14         Pupils       Pupils Dark Light APD   Right PERRL 4 3 None   Left PERRL 5 4 None         Visual Fields (Counting fingers)       Left Right    Full Full         Extraocular Movement       Right Left    Full Full         Neuro/Psych     Oriented x3: Yes   Mood/Affect: Normal         Dilation     Both eyes: 1.0% Mydriacyl, 2.5% Phenylephrine @ 3:18 PM           Slit Lamp and Fundus Exam     External Exam       Right Left   External Normal Normal         Slit Lamp Exam       Right Left   Lids/Lashes Normal Normal   Conjunctiva/Sclera White and quiet, clearing conjunctival hemorrhage White and quiet   Cornea Clear Clear   Anterior Chamber Deep and quiet Deep and quiet   Iris Round and reactive Round and reactive   Lens Posterior chamber intraocular lens Posterior chamber intraocular lens   Anterior Vitreous Normal Normal         Fundus Exam       Right Left   Posterior Vitreous Vitrectomized, clear Clear,  vitrectomized   Disc Normal Normal   C/D Ratio 0.55 0.55   Macula Normal Normal   Vessels Normal Normal  Periphery Good retinopexy superiorly, scleral indentation visible, from buckle Normal, good retinopexy,  attached, no new retinal breaks.  Retina attached            IMAGING AND PROCEDURES  Imaging and Procedures for 12/06/21  OCT, Retina - OU - Both Eyes       Right Eye Quality was good. Scan locations included subfoveal. Central Foveal Thickness: 322. Findings include abnormal foveal contour, epiretinal membrane.   Left Eye Quality was good. Scan locations included subfoveal. Central Foveal Thickness: 304. Progression has been stable. Findings include normal foveal contour.   Notes OD: clear media, minor epiretinal membrane nasal portion of macula with no significant foveal distortion observe OS: clear      Color Fundus Photography Optos - OU - Both Eyes       Right Eye Progression has improved. Disc findings include normal observations. Macula : normal observations. Vessels : normal observations.   Left Eye Progression has improved. Disc findings include normal observations.   Notes OD, good retinopexy, retina attached clear media       Fluorescein Angiography Optos (Transit OS)       Injection: 500 mg Fluorescein Sodium 10 %   Route: Intravenous   NDC: [phone]   Left Eye   Early phase findings include normal observations. Mid/Late phase findings include leakage, microaneurysm.   Notes OS on the temporal aspect of the fovea on late photos of the Optos fluorescein angiogram there is a light blush on the temporal aspect of the fovea.  This is consistent with MAC-TEL  OD, temporal retinal vessels to the macula also have vascular remodeling and straightening also consistent with longstanding MAC-TEL but no active leakage at this time.  Could also be a small twig PVO which has resolved.  But no CME present.              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  This may be consistent with the patient's recent symptomatology in the left eye  The patient in fact does have a positive view of systems for sleep apnea.  I have urged the patient promptly to seek medical evaluation and consideration for sleep testing potentially at home sleep testing and if found to have sleep apnea to undertake all measures to consider therapy so as to protect his medical health, to decrease his risk of stroke and heart attack, and in my opinion and experience to stop the presence and development and worsening of MAC-TEL and alleviate symptomatology thereof  Macular telangiectasis (MAC-TEL), or parafoveal telangiectasis is a condition of "unknown" cause.  Findings in or near the macula (center of vision) consist of microaneurysms (leaking small capillaries), often with leakage of fluid which in the active phase can impact fine discriminatory vision, and in some cases trigger profound scarring in the macula, with severe permanent vision loss.  Standard treatment is observation and periodic examinations to monitor for treatable complications.   The cause  of this condition is "unknown".  However, the practice of Dr. Zadie Rhine has discovered an association with sleep apnea with its  nightly periods of low oxygen in the blood stream (hypoxia), retained carbon dioxide (hypercapnia), associated with transient nocturnal hypertensive episodes.   More recently, some patients also been found to have advanced lung disease,  whether asthma or COPD, with similar findings.  Dr. Zadie Rhine has been evaluating the association of sleep apnea, nightly hypoxia, and Macular telangiectasis for over 18 years.  Most patients are found to be noncompliant with sleep apnea therapy or testing in the past.  Resumption of CPAP or similar therapy is strongly recommended if ordered in the past.  Upon review of risk factors or findings positive for sleep apnea, more formal, extensive sleep laboratory or home testing, may be recommended.  Numerous patients, proven to have MAC-TEL, have improved or resolved their ey condition promptly, within weeks, of the use of nighttime oxygen supplementation or continuous positive airway pressure (CPAP).     ICD-10-CM   1. Type 2 macular telangiectasis, left  H35.072 Fluorescein Angiography Optos (Transit OS)    Fluorescein Sodium 10 % injection 500 mg    2. Vitreous membranes or strands, left  H43.312     3. Left epiretinal membrane  H35.372 OCT, Retina - OU - Both Eyes    Color Fundus Photography Optos - OU - Both Eyes    Fluorescein Angiography Optos (Transit OS)    Fluorescein Sodium 10 % injection 500 mg    CANCELED: Color Fundus Photography Optos - OS - Left Eye    4. Stable branch retinal vein occlusion of right eye  H34.8312       1.  OS with tangible finding only on fluorescein angiogram with very light smudge temporal aspect of the fovea in the "watershed region of perfusion".  Highly suggestive and consistent with MAC-TEL.  Early symptomatology this might be an early finding.    2.  Positive review of symptomatology with sleep apnea.  I have encouraged patient to seek testing with a home sleep test, he will need to promptly arrange home sleep study potentially.  3.  Patient to notify the office promptly of testing schedule as well as results.  Patient return promptly if new onset symptomatology  Ophthalmic Meds Ordered this visit:  Meds ordered this encounter  Medications   Fluorescein  Sodium 10 % injection 500 mg       Return in about 7 weeks (around 01/24/2022) for DILATE OU, OCT.  There are no Patient Instructions on file for this visit.   Explained the diagnoses, plan, and follow up with the patient and they expressed understanding.  Patient expressed understanding of the importance of proper follow up care.   Clent Demark Chanteria Haggard M.D. Diseases & Surgery of the Retina and Vitreous Retina & Diabetic [place] 12/06/21     Abbreviations: M myopia (nearsighted); A astigmatism; H hyperopia (farsighted); P presbyopia; Mrx spectacle prescription;  CTL contact lenses; OD right eye; OS left eye; OU both eyes  XT exotropia; ET esotropia; PEK punctate epithelial keratitis; PEE punctate epithelial erosions; DES dry eye syndrome; MGD meibomian gland dysfunction; ATs artificial tears; PFAT's preservative free artificial tears; [place] nuclear sclerotic cataract; PSC posterior subcapsular cataract; ERM epi-retinal membrane; PVD posterior vitreous detachment; RD retinal detachment; DM diabetes mellitus; DR diabetic retinopathy; NPDR non-proliferative diabetic retinopathy; PDR proliferative diabetic retinopathy; CSME clinically significant macular edema; DME diabetic macular edema; dbh dot blot hemorrhages; CWS cotton wool spot; POAG primary open angle glaucoma; C/D cup-to-disc ratio; HVF humphrey visual field; GVF goldmann visual field; OCT optical coherence tomography; IOP intraocular pressure; BRVO  Branch retinal vein occlusion; CRVO central retinal vein occlusion; CRAO central retinal artery occlusion; BRAO branch retinal artery occlusion; RT retinal tear; SB scleral buckle; PPV pars plana vitrectomy; VH Vitreous hemorrhage; PRP panretinal laser photocoagulation; IVK intravitreal kenalog; VMT vitreomacular traction; MH Macular hole;  NVD neovascularization of the disc; NVE neovascularization elsewhere; AREDS age related eye disease study; ARMD age related macular degeneration; POAG primary open  angle glaucoma; EBMD epithelial/anterior basement membrane dystrophy; ACIOL anterior chamber intraocular lens; IOL intraocular lens; PCIOL posterior chamber intraocular lens; Phaco/IOL phacoemulsification with intraocular lens placement; [place] photorefractive keratectomy; LASIK laser assisted in situ keratomileusis; HTN hypertension; DM diabetes mellitus; COPD chronic obstructive pulmonary disease

## 2021-12-06 NOTE — Assessment & Plan Note (Signed)
Old macular BRVO OD temporally, appears resolved

## 2021-12-12 ENCOUNTER — Encounter (INDEPENDENT_AMBULATORY_CARE_PROVIDER_SITE_OTHER): Payer: 59 | Admitting: Ophthalmology

## 2021-12-12 ENCOUNTER — Encounter (INDEPENDENT_AMBULATORY_CARE_PROVIDER_SITE_OTHER): Payer: Self-pay

## 2021-12-29 ENCOUNTER — Other Ambulatory Visit: Payer: Self-pay | Admitting: Cardiology

## 2021-12-29 DIAGNOSIS — I6521 Occlusion and stenosis of right carotid artery: Secondary | ICD-10-CM

## 2021-12-29 DIAGNOSIS — E782 Mixed hyperlipidemia: Secondary | ICD-10-CM

## 2022-01-13 ENCOUNTER — Other Ambulatory Visit: Payer: 59

## 2022-01-24 ENCOUNTER — Encounter (INDEPENDENT_AMBULATORY_CARE_PROVIDER_SITE_OTHER): Payer: BC Managed Care – PPO | Admitting: Ophthalmology

## 2022-04-02 ENCOUNTER — Other Ambulatory Visit: Payer: Self-pay | Admitting: Cardiology

## 2022-04-02 DIAGNOSIS — I6521 Occlusion and stenosis of right carotid artery: Secondary | ICD-10-CM

## 2022-04-02 DIAGNOSIS — E782 Mixed hyperlipidemia: Secondary | ICD-10-CM

## 2022-06-19 ENCOUNTER — Encounter (INDEPENDENT_AMBULATORY_CARE_PROVIDER_SITE_OTHER): Payer: 59 | Admitting: Ophthalmology

## 2022-06-22 ENCOUNTER — Encounter (INDEPENDENT_AMBULATORY_CARE_PROVIDER_SITE_OTHER): Payer: 59 | Admitting: Ophthalmology

## 2024-01-18 ENCOUNTER — Ambulatory Visit: Admitting: Urology

## 2024-01-18 ENCOUNTER — Encounter: Payer: Self-pay | Admitting: Urology

## 2024-01-18 VITALS — BP 136/75 | HR 75

## 2024-01-18 DIAGNOSIS — R972 Elevated prostate specific antigen [PSA]: Secondary | ICD-10-CM | POA: Diagnosis not present

## 2024-01-18 DIAGNOSIS — R351 Nocturia: Secondary | ICD-10-CM | POA: Diagnosis not present

## 2024-01-18 DIAGNOSIS — N401 Enlarged prostate with lower urinary tract symptoms: Secondary | ICD-10-CM

## 2024-01-18 DIAGNOSIS — N138 Other obstructive and reflux uropathy: Secondary | ICD-10-CM

## 2024-01-18 LAB — URINALYSIS, ROUTINE W REFLEX MICROSCOPIC
Bilirubin, UA: NEGATIVE
Glucose, UA: NEGATIVE
Ketones, UA: NEGATIVE
Leukocytes,UA: NEGATIVE
Nitrite, UA: NEGATIVE
Protein,UA: NEGATIVE
RBC, UA: NEGATIVE
Specific Gravity, UA: 1.015 (ref 1.005–1.030)
Urobilinogen, Ur: 0.2 mg/dL (ref 0.2–1.0)
pH, UA: 6 (ref 5.0–7.5)

## 2024-01-18 LAB — BLADDER SCAN AMB NON-IMAGING: Scan Result: 1

## 2024-01-18 MED ORDER — ALFUZOSIN HCL ER 10 MG PO TB24: 10.0000 mg | ORAL_TABLET | Freq: Every day | ORAL | 11 refills | Status: DC

## 2024-01-18 NOTE — Patient Instructions (Signed)

## 2024-01-18 NOTE — Progress Notes (Signed)
 01/18/2024 10:00 AM   Jake Clark 07/23/1955 [id number]  Referring provider: Jolee Elsie RAMAN, PA [address] [place],  [place] [address]  Elevated PSA   HPI: Jake Clark is a 67yo here for evaluation of elevated PSA. PSA 6.4 in 10/2023. He previously saw Dr. Tanda and had a prostate MRI in the past and prostate biopsy in 2019. IPSS 29 QOL 4. He strains to urinate, he has a weak urinary stream. He has dribbling. Nocturia 2-3x despite decreasing fluids within 2 hours of going to bed. He has bothersome urinary urgency. He previously took flomax and finasteride in the past which failed to improve his LUTS.    PMH: Past Medical History:  Diagnosis Date   Arthritis    GERD (gastroesophageal reflux disease)    History of hiatal hernia    HTN (hypertension)    Posterior vitreous detachment of left eye 01/19/2020   Posterior vitreous detachment of right eye 12/10/2019   Vitreomacular adhesion of left eye 12/10/2019   Vitreous floaters of right eye 12/10/2019   The nature of vitreous floaters was discussed with the patient as well as their frequent occurrence with development of posterior vitreous detachment. The significance of flashes and field cuts was discussed with the patient. The need for a dilated fundus examination was discussed and advice given to return immediately for new or different floaters .  More uncommonly, vitreous floaters, debris, or   Vitreous membranes and strands, right 01/01/2020   The nature of vitreous floaters was discussed with the patient as well as their frequent occurrence with development of posterior vitreous detachment. The significance of flashes and field cuts was discussed with the patient. The need for a dilated fundus examination was discussed and advice given to return immediately for new or different floaters .  More uncommonly, vitreous floaters, debris, or   Vitreous membranes or strands, left 01/19/2020   Wears glasses     Surgical History: Past  Surgical History:  Procedure Laterality Date   ANKLE FRACTURE SURGERY     left / 2005    EYE SURGERY Right 01/28/2020   Vitrectomy, Dr. Elner   KNEE SURGERY     scoped 1994/left    TOTAL HIP ARTHROPLASTY Left 07/07/2015   Procedure: LEFT TOTAL HIP ARTHROPLASTY ANTERIOR APPROACH;  Surgeon: Dempsey Moan, MD;  Location: [facility];  Service: Orthopedics;  Laterality: Left;   VASECTOMY      Home Medications:  Allergies as of 01/18/2024       Reactions   Latex Other (See Comments)   Skin sensitivity    Diclofenac Sodium Itching, Rash        Medication List        Accurate as of January 18, 2024 10:00 AM. If you have any questions, ask your nurse or doctor.          STOP taking these medications    clobetasol cream 0.05 % Commonly known as: TEMOVATE Stopped by: Belvie Clara   terbinafine 250 MG tablet Commonly known as: LAMISIL Stopped by: Belvie Clara       TAKE these medications    amLODipine  10 MG tablet Commonly known as: NORVASC  Take 1 tablet by mouth daily.   [facility]S Aspirin  Low Dose 81 MG tablet Generic drug: aspirin  EC TAKE 1 TABLET (81 MG TOTAL) BY MOUTH DAILY. SWALLOW WHOLE.   hydrochlorothiazide  12.5 MG capsule Commonly known as: MICROZIDE  Take 25 mg by mouth daily.   losartan 100 MG tablet Commonly known as: COZAAR  Take 100 mg by mouth daily.   rosuvastatin  5 MG tablet Commonly known as: CRESTOR  TAKE 1 TABLET BY MOUTH EVERYDAY AT BEDTIME        Allergies:  Allergies  Allergen Reactions   Latex Other (See Comments)    Skin sensitivity    Diclofenac Sodium Itching and Rash    Family History: Family History  Problem Relation Age of Onset   Atrial fibrillation Mother    Dementia Father    Heart disease Other     Social History:  reports that he has never smoked. He has never used smokeless tobacco. He reports that he does not drink alcohol and does not use drugs.  ROS: All other review of systems were reviewed and are negative  except what is noted above in HPI  Physical Exam: BP 136/75   Pulse 75   Constitutional:  Alert and oriented, No acute distress. HEENT: [place] AT, moist mucus membranes.  Trachea midline, no masses. Cardiovascular: No clubbing, cyanosis, or edema. Respiratory: Normal respiratory effort, no increased work of breathing. GI: Abdomen is soft, nontender, nondistended, no abdominal masses GU: No CVA tenderness. Circumcised phallus. No masses/lesions on penis, testis, scrotum. Prostate 40g smooth no nodules no induration.  Lymph: No cervical or inguinal lymphadenopathy. Skin: No rashes, bruises or suspicious lesions. Neurologic: Grossly intact, no focal deficits, moving all 4 extremities. Psychiatric: Normal mood and affect.  Laboratory Data: Lab Results  Component Value Date   WBC 11.8 (H) 07/08/2015   HGB 13.2 07/08/2015   HCT 39.3 07/08/2015   MCV 87.3 07/08/2015   PLT 251 07/08/2015    Lab Results  Component Value Date   CREATININE 0.62 07/08/2015    No results found for: PSA  No results found for: TESTOSTERONE  No results found for: HGBA1C  Urinalysis    Component Value Date/Time   COLORURINE YELLOW 07/01/2015 1511   APPEARANCEUR CLOUDY (A) 07/01/2015 1511   LABSPEC 1.017 07/01/2015 1511   PHURINE 6.0 07/01/2015 1511   GLUCOSEU NEGATIVE 07/01/2015 1511   HGBUR NEGATIVE 07/01/2015 1511   BILIRUBINUR NEGATIVE 07/01/2015 1511   KETONESUR NEGATIVE 07/01/2015 1511   PROTEINUR NEGATIVE 07/01/2015 1511   NITRITE NEGATIVE 07/01/2015 1511   LEUKOCYTESUR TRACE (A) 07/01/2015 1511    Lab Results  Component Value Date   BACTERIA RARE (A) 07/01/2015    Pertinent Imaging: No results found for this or any previous visit.  No results found for this or any previous visit.  No results found for this or any previous visit.  No results found for this or any previous visit.  No results found for this or any previous visit.  No results found for this or any previous  visit.  No results found for this or any previous visit.  No results found for this or any previous visit.   Assessment & Plan:    1. Elevated PSA (Primary) -ISOPSA, will call with results, if elevated we will proceed with MRI and possible fusion biopsy. If normal, followup in 6-12 months with a PSA - Urinalysis, Routine w reflex microscopic - BLADDER SCAN AMB NON-IMAGING  2. BPh with nocturia -we will trial uroxatral 10mg  qhs   No follow-ups on file.  Belvie Clara, MD  [facility] [place]

## 2024-01-18 NOTE — Progress Notes (Signed)
 Bladder Scan completed today.  Patient can void prior to the bladder scan. Bladder scan result: 1  Performed By: [facility] LPN  Additional notes-

## 2024-01-25 ENCOUNTER — Encounter: Payer: Self-pay | Admitting: Urology

## 2024-02-12 ENCOUNTER — Telehealth: Payer: Self-pay

## 2024-02-12 NOTE — Telephone Encounter (Signed)
 Patient made aware of IsoPsa results and providers recommendation. Patient request to f/u in 6 months.

## 2024-02-12 NOTE — Telephone Encounter (Signed)
-----   Message from Belvie Clara sent at 02/12/2024  9:53 AM EDT ----- yes ----- Message ----- From: Malachy Slice, LPN Sent: 07/03/7972  12:49 PM EDT To: Belvie LITTIE Clara, MD  Ok to followup in 6-12 months with a PSA?

## 2024-04-02 DIAGNOSIS — M25562 Pain in left knee: Secondary | ICD-10-CM | POA: Diagnosis not present

## 2024-04-04 DIAGNOSIS — Z23 Encounter for immunization: Secondary | ICD-10-CM | POA: Diagnosis not present

## 2024-04-25 ENCOUNTER — Ambulatory Visit: Admitting: Urology

## 2024-05-16 DIAGNOSIS — M1811 Unilateral primary osteoarthritis of first carpometacarpal joint, right hand: Secondary | ICD-10-CM | POA: Diagnosis not present

## 2024-07-23 ENCOUNTER — Other Ambulatory Visit: Payer: Self-pay

## 2024-07-23 DIAGNOSIS — R972 Elevated prostate specific antigen [PSA]: Secondary | ICD-10-CM

## 2024-07-24 ENCOUNTER — Other Ambulatory Visit: Payer: Self-pay

## 2024-07-24 DIAGNOSIS — R972 Elevated prostate specific antigen [PSA]: Secondary | ICD-10-CM

## 2024-07-25 LAB — PSA: Prostate Specific Ag, Serum: 8 ng/mL — ABNORMAL HIGH (ref 0.0–4.0)

## 2024-07-30 ENCOUNTER — Ambulatory Visit: Admitting: Urology

## 2024-07-30 VITALS — BP 126/65 | HR 86

## 2024-07-30 DIAGNOSIS — R972 Elevated prostate specific antigen [PSA]: Secondary | ICD-10-CM

## 2024-07-30 DIAGNOSIS — N401 Enlarged prostate with lower urinary tract symptoms: Secondary | ICD-10-CM

## 2024-07-30 DIAGNOSIS — R351 Nocturia: Secondary | ICD-10-CM

## 2024-07-30 LAB — URINALYSIS, ROUTINE W REFLEX MICROSCOPIC
Bilirubin, UA: NEGATIVE
Glucose, UA: NEGATIVE
Ketones, UA: NEGATIVE
Leukocytes,UA: NEGATIVE
Nitrite, UA: NEGATIVE
Protein,UA: NEGATIVE
RBC, UA: NEGATIVE
Specific Gravity, UA: 1.01 (ref 1.005–1.030)
Urobilinogen, Ur: 0.2 mg/dL (ref 0.2–1.0)
pH, UA: 6 (ref 5.0–7.5)

## 2024-07-30 MED ORDER — ALFUZOSIN HCL ER 10 MG PO TB24: 10.0000 mg | ORAL_TABLET | Freq: Two times a day (BID) | ORAL | 11 refills | Status: AC

## 2024-07-30 NOTE — Progress Notes (Unsigned)
 "  07/30/2024 8:22 AM   Jake Clark 12-31-1955 [id number]  Referring provider: Geroge Maxwell, FNP [address] [place],  [place] [address]  Followup elevated PSA amd BPH   HPI: Mr Sandoval is a 35bn here for followup fro elevated PSA and BPH with nocturia. PSA increased to 8.0 from 6.4. IsoPSa was 8.9 six months ago. IPSS 13 QOL 3 on uroxatral  10mg  daily.  Nocturia improved to 0-2x. Urine stream is fair. He has occasional straining to urinate.    PMH: Past Medical History:  Diagnosis Date   Arthritis    GERD (gastroesophageal reflux disease)    History of hiatal hernia    HTN (hypertension)    Posterior vitreous detachment of left eye 01/19/2020   Posterior vitreous detachment of right eye 12/10/2019   Vitreomacular adhesion of left eye 12/10/2019   Vitreous floaters of right eye 12/10/2019   The nature of vitreous floaters was discussed with the patient as well as their frequent occurrence with development of posterior vitreous detachment. The significance of flashes and field cuts was discussed with the patient. The need for a dilated fundus examination was discussed and advice given to return immediately for new or different floaters .  More uncommonly, vitreous floaters, debris, or   Vitreous membranes and strands, right 01/01/2020   The nature of vitreous floaters was discussed with the patient as well as their frequent occurrence with development of posterior vitreous detachment. The significance of flashes and field cuts was discussed with the patient. The need for a dilated fundus examination was discussed and advice given to return immediately for new or different floaters .  More uncommonly, vitreous floaters, debris, or   Vitreous membranes or strands, left 01/19/2020   Wears glasses     Surgical History: Past Surgical History:  Procedure Laterality Date   ANKLE FRACTURE SURGERY     left / 2005    EYE SURGERY Right 01/28/2020   Vitrectomy, Dr. Elner   KNEE  SURGERY     scoped 1994/left    TOTAL HIP ARTHROPLASTY Left 07/07/2015   Procedure: LEFT TOTAL HIP ARTHROPLASTY ANTERIOR APPROACH;  Surgeon: Dempsey Moan, MD;  Location: [facility];  Service: Orthopedics;  Laterality: Left;   VASECTOMY      Home Medications:  Allergies as of 07/30/2024       Reactions   Latex Other (See Comments)   Skin sensitivity    Diclofenac Sodium Itching, Rash        Medication List        Accurate as of July 30, 2024  8:22 AM. If you have any questions, ask your nurse or doctor.          alfuzosin  10 MG 24 hr tablet Commonly known as: UROXATRAL  Take 1 tablet (10 mg total) by mouth at bedtime.   amLODipine  10 MG tablet Commonly known as: NORVASC  Take 1 tablet by mouth daily.   [facility] Aspirin  Low Dose 81 MG tablet Generic drug: aspirin  EC TAKE 1 TABLET (81 MG TOTAL) BY MOUTH DAILY. SWALLOW WHOLE.   hydrochlorothiazide  12.5 MG capsule Commonly known as: MICROZIDE  Take 25 mg by mouth daily.   losartan 100 MG tablet Commonly known as: COZAAR Take 100 mg by mouth daily.   rosuvastatin  5 MG tablet Commonly known as: CRESTOR  TAKE 1 TABLET BY MOUTH EVERYDAY AT BEDTIME        Allergies: Allergies[1]  Family History: Family History  Problem Relation Age of Onset   Atrial fibrillation Mother  Dementia Father    Heart disease Other     Social History:  reports that he has never smoked. He has never used smokeless tobacco. He reports that he does not drink alcohol and does not use drugs.  ROS: All other review of systems were reviewed and are negative except what is noted above in HPI  Physical Exam: BP 126/65   Pulse 86   Constitutional:  Alert and oriented, No acute distress. HEENT: [place] AT, moist mucus membranes.  Trachea midline, no masses. Cardiovascular: No clubbing, cyanosis, or edema. Respiratory: Normal respiratory effort, no increased work of breathing. GI: Abdomen is soft, nontender, nondistended, no abdominal masses GU: No  CVA tenderness.  Lymph: No cervical or inguinal lymphadenopathy. Skin: No rashes, bruises or suspicious lesions. Neurologic: Grossly intact, no focal deficits, moving all 4 extremities. Psychiatric: Normal mood and affect.  Laboratory Data: Lab Results  Component Value Date   WBC 11.8 (H) 07/08/2015   HGB 13.2 07/08/2015   HCT 39.3 07/08/2015   MCV 87.3 07/08/2015   PLT 251 07/08/2015    Lab Results  Component Value Date   CREATININE 0.62 07/08/2015    No results found for: PSA  No results found for: TESTOSTERONE  No results found for: HGBA1C  Urinalysis    Component Value Date/Time   COLORURINE YELLOW 07/01/2015 1511   APPEARANCEUR Clear 01/18/2024 0916   LABSPEC 1.017 07/01/2015 1511   PHURINE 6.0 07/01/2015 1511   GLUCOSEU Negative 01/18/2024 0916   HGBUR NEGATIVE 07/01/2015 1511   BILIRUBINUR Negative 01/18/2024 0916   KETONESUR NEGATIVE 07/01/2015 1511   PROTEINUR Negative 01/18/2024 0916   PROTEINUR NEGATIVE 07/01/2015 1511   NITRITE Negative 01/18/2024 0916   NITRITE NEGATIVE 07/01/2015 1511   LEUKOCYTESUR Negative 01/18/2024 0916    Lab Results  Component Value Date   LABMICR Comment 01/18/2024   BACTERIA RARE (A) 07/01/2015    Pertinent Imaging: *** No results found for this or any previous visit.  No results found for this or any previous visit.  No results found for this or any previous visit.  No results found for this or any previous visit.  No results found for this or any previous visit.  No results found for this or any previous visit.  No results found for this or any previous visit.  No results found for this or any previous visit.   Assessment & Plan:    1. Elevated PSA (Primary) Prostate MRI, will call with results - Urinalysis, Routine w reflex microscopic  2. Benign prostatic hyperplasia with urinary obstruction ***  3. Nocturia ***   No follow-ups on file.  Belvie Clara, MD  [facility]  [place]      [1]  Allergies Allergen Reactions   Latex Other (See Comments)    Skin sensitivity    Diclofenac Sodium Itching and Rash   "

## 2024-08-08 ENCOUNTER — Ambulatory Visit (HOSPITAL_COMMUNITY)

## 2024-08-14 ENCOUNTER — Other Ambulatory Visit

## 2025-01-30 ENCOUNTER — Other Ambulatory Visit

## 2025-02-11 ENCOUNTER — Ambulatory Visit: Admitting: Urology
